# Patient Record
Sex: Female | Born: 1935 | Race: White | Hispanic: No | Smoking: Never smoker
Health system: Southern US, Community
[De-identification: ages and names within clinical notes are randomized; demographics above are authoritative.]

## PROBLEM LIST (undated history)

## (undated) DIAGNOSIS — E785 Hyperlipidemia, unspecified: Secondary | ICD-10-CM

## (undated) DIAGNOSIS — R55 Syncope and collapse: Secondary | ICD-10-CM

## (undated) HISTORY — PX: OTHER SURGICAL HISTORY: SHX169

## (undated) HISTORY — DX: Syncope and collapse: R55

## (undated) HISTORY — DX: Hyperlipidemia, unspecified: E78.5

---

## 2009-06-19 ENCOUNTER — Emergency Department (HOSPITAL_BASED_OUTPATIENT_CLINIC_OR_DEPARTMENT_OTHER): Admission: EM | Admit: 2009-06-19 | Discharge: 2009-06-19 | Payer: Self-pay | Admitting: Emergency Medicine

## 2011-06-26 ENCOUNTER — Other Ambulatory Visit: Payer: Self-pay | Admitting: Internal Medicine

## 2011-06-26 DIAGNOSIS — N6310 Unspecified lump in the right breast, unspecified quadrant: Secondary | ICD-10-CM

## 2011-07-13 ENCOUNTER — Other Ambulatory Visit: Payer: Self-pay | Admitting: Internal Medicine

## 2011-07-13 ENCOUNTER — Ambulatory Visit
Admission: RE | Admit: 2011-07-13 | Discharge: 2011-07-13 | Disposition: A | Discharge status: Home / Self Care | Payer: Medicare Other | Source: Ambulatory Visit | Attending: Internal Medicine | Admitting: Internal Medicine

## 2011-07-13 DIAGNOSIS — N6310 Unspecified lump in the right breast, unspecified quadrant: Secondary | ICD-10-CM

## 2011-12-04 ENCOUNTER — Other Ambulatory Visit: Payer: Self-pay | Admitting: Internal Medicine

## 2011-12-04 DIAGNOSIS — N63 Unspecified lump in unspecified breast: Secondary | ICD-10-CM

## 2012-01-15 ENCOUNTER — Ambulatory Visit
Admission: RE | Admit: 2012-01-15 | Discharge: 2012-01-15 | Disposition: A | Discharge status: Home / Self Care | Payer: Medicare Other | Source: Ambulatory Visit | Attending: Internal Medicine | Admitting: Internal Medicine

## 2012-01-15 DIAGNOSIS — N63 Unspecified lump in unspecified breast: Secondary | ICD-10-CM

## 2012-05-08 HISTORY — PX: BREAST BIOPSY: SHX20

## 2012-06-11 ENCOUNTER — Other Ambulatory Visit: Payer: Self-pay | Admitting: Internal Medicine

## 2012-06-11 DIAGNOSIS — N6489 Other specified disorders of breast: Secondary | ICD-10-CM

## 2012-07-11 ENCOUNTER — Ambulatory Visit
Admission: RE | Admit: 2012-07-11 | Discharge: 2012-07-11 | Disposition: A | Discharge status: Home / Self Care | Payer: Medicare Other | Source: Ambulatory Visit | Attending: Internal Medicine | Admitting: Internal Medicine

## 2012-07-11 ENCOUNTER — Other Ambulatory Visit: Payer: Self-pay | Admitting: Internal Medicine

## 2012-07-11 DIAGNOSIS — N6489 Other specified disorders of breast: Secondary | ICD-10-CM

## 2012-07-17 ENCOUNTER — Other Ambulatory Visit: Payer: Self-pay | Admitting: Internal Medicine

## 2012-07-17 DIAGNOSIS — N6489 Other specified disorders of breast: Secondary | ICD-10-CM

## 2012-07-25 ENCOUNTER — Ambulatory Visit
Admission: RE | Admit: 2012-07-25 | Discharge: 2012-07-25 | Disposition: A | Discharge status: Home / Self Care | Payer: Medicare Other | Source: Ambulatory Visit | Attending: Internal Medicine | Admitting: Internal Medicine

## 2012-07-25 DIAGNOSIS — N6489 Other specified disorders of breast: Secondary | ICD-10-CM

## 2013-06-03 ENCOUNTER — Other Ambulatory Visit: Payer: Self-pay

## 2013-06-03 DIAGNOSIS — Z1231 Encounter for screening mammogram for malignant neoplasm of breast: Secondary | ICD-10-CM

## 2013-07-14 ENCOUNTER — Ambulatory Visit
Admission: RE | Admit: 2013-07-14 | Discharge: 2013-07-14 | Disposition: A | Discharge status: Home / Self Care | Payer: Medicare Other | Source: Ambulatory Visit

## 2013-07-14 ENCOUNTER — Ambulatory Visit: Payer: Medicare Other

## 2013-07-14 DIAGNOSIS — Z1231 Encounter for screening mammogram for malignant neoplasm of breast: Secondary | ICD-10-CM

## 2014-06-08 ENCOUNTER — Other Ambulatory Visit: Payer: Self-pay

## 2014-06-08 DIAGNOSIS — Z1231 Encounter for screening mammogram for malignant neoplasm of breast: Secondary | ICD-10-CM

## 2014-07-16 ENCOUNTER — Ambulatory Visit
Admission: RE | Admit: 2014-07-16 | Discharge: 2014-07-16 | Disposition: A | Discharge status: Home / Self Care | Payer: Medicare Other | Source: Ambulatory Visit

## 2014-07-16 ENCOUNTER — Ambulatory Visit: Payer: Self-pay

## 2014-07-16 DIAGNOSIS — Z1231 Encounter for screening mammogram for malignant neoplasm of breast: Secondary | ICD-10-CM

## 2015-06-18 ENCOUNTER — Other Ambulatory Visit: Payer: Self-pay

## 2015-06-18 DIAGNOSIS — Z1231 Encounter for screening mammogram for malignant neoplasm of breast: Secondary | ICD-10-CM

## 2015-07-29 ENCOUNTER — Ambulatory Visit
Admission: RE | Admit: 2015-07-29 | Discharge: 2015-07-29 | Disposition: A | Discharge status: Home / Self Care | Payer: Medicare Other | Source: Ambulatory Visit

## 2015-07-29 DIAGNOSIS — Z1231 Encounter for screening mammogram for malignant neoplasm of breast: Secondary | ICD-10-CM

## 2015-12-15 DIAGNOSIS — E7800 Pure hypercholesterolemia, unspecified: Secondary | ICD-10-CM | POA: Insufficient documentation

## 2016-06-16 ENCOUNTER — Other Ambulatory Visit: Payer: Self-pay | Admitting: Internal Medicine

## 2016-06-16 DIAGNOSIS — Z1231 Encounter for screening mammogram for malignant neoplasm of breast: Secondary | ICD-10-CM

## 2016-07-31 ENCOUNTER — Ambulatory Visit
Admission: RE | Admit: 2016-07-31 | Discharge: 2016-07-31 | Disposition: A | Discharge status: Home / Self Care | Payer: Medicare Other | Source: Ambulatory Visit | Attending: Internal Medicine | Admitting: Internal Medicine

## 2016-07-31 DIAGNOSIS — Z1231 Encounter for screening mammogram for malignant neoplasm of breast: Secondary | ICD-10-CM

## 2016-12-14 DIAGNOSIS — M8589 Other specified disorders of bone density and structure, multiple sites: Secondary | ICD-10-CM | POA: Insufficient documentation

## 2016-12-14 DIAGNOSIS — G8929 Other chronic pain: Secondary | ICD-10-CM | POA: Insufficient documentation

## 2017-07-04 ENCOUNTER — Other Ambulatory Visit: Payer: Self-pay | Admitting: Internal Medicine

## 2017-07-04 DIAGNOSIS — Z1231 Encounter for screening mammogram for malignant neoplasm of breast: Secondary | ICD-10-CM

## 2017-08-02 ENCOUNTER — Ambulatory Visit
Admission: RE | Admit: 2017-08-02 | Discharge: 2017-08-02 | Disposition: A | Discharge status: Home / Self Care | Payer: Medicare Other | Source: Ambulatory Visit | Attending: Internal Medicine | Admitting: Internal Medicine

## 2017-08-02 DIAGNOSIS — Z1231 Encounter for screening mammogram for malignant neoplasm of breast: Secondary | ICD-10-CM

## 2018-05-13 DIAGNOSIS — M19071 Primary osteoarthritis, right ankle and foot: Secondary | ICD-10-CM | POA: Insufficient documentation

## 2018-07-12 ENCOUNTER — Other Ambulatory Visit: Payer: Self-pay | Admitting: Internal Medicine

## 2018-07-12 DIAGNOSIS — Z1231 Encounter for screening mammogram for malignant neoplasm of breast: Secondary | ICD-10-CM

## 2018-07-26 ENCOUNTER — Other Ambulatory Visit: Payer: Self-pay | Admitting: Internal Medicine

## 2018-07-26 DIAGNOSIS — N649 Disorder of breast, unspecified: Secondary | ICD-10-CM

## 2018-07-30 ENCOUNTER — Ambulatory Visit: Payer: Medicare Other

## 2018-07-30 ENCOUNTER — Ambulatory Visit
Admission: RE | Admit: 2018-07-30 | Discharge: 2018-07-30 | Disposition: A | Discharge status: Home / Self Care | Payer: Medicare Other | Source: Ambulatory Visit | Attending: Internal Medicine | Admitting: Internal Medicine

## 2018-07-30 ENCOUNTER — Other Ambulatory Visit: Payer: Self-pay

## 2018-07-30 DIAGNOSIS — N649 Disorder of breast, unspecified: Secondary | ICD-10-CM

## 2018-08-13 ENCOUNTER — Ambulatory Visit: Payer: Medicare Other

## 2018-09-18 ENCOUNTER — Ambulatory Visit: Payer: Medicare Other

## 2018-10-01 ENCOUNTER — Ambulatory Visit
Admission: RE | Admit: 2018-10-01 | Discharge: 2018-10-01 | Disposition: A | Discharge status: Home / Self Care | Payer: Medicare Other | Source: Ambulatory Visit | Attending: Internal Medicine | Admitting: Internal Medicine

## 2018-10-01 ENCOUNTER — Other Ambulatory Visit: Payer: Self-pay

## 2018-10-01 DIAGNOSIS — Z1231 Encounter for screening mammogram for malignant neoplasm of breast: Secondary | ICD-10-CM

## 2018-10-02 ENCOUNTER — Other Ambulatory Visit: Payer: Self-pay | Admitting: Internal Medicine

## 2018-10-02 DIAGNOSIS — R921 Mammographic calcification found on diagnostic imaging of breast: Secondary | ICD-10-CM

## 2018-10-07 ENCOUNTER — Ambulatory Visit
Admission: RE | Admit: 2018-10-07 | Discharge: 2018-10-07 | Disposition: A | Discharge status: Home / Self Care | Payer: Medicare Other | Source: Ambulatory Visit | Attending: Internal Medicine | Admitting: Internal Medicine

## 2018-10-07 ENCOUNTER — Other Ambulatory Visit: Payer: Self-pay

## 2018-10-07 DIAGNOSIS — R921 Mammographic calcification found on diagnostic imaging of breast: Secondary | ICD-10-CM

## 2019-07-16 DIAGNOSIS — R7301 Impaired fasting glucose: Secondary | ICD-10-CM | POA: Insufficient documentation

## 2019-09-09 DIAGNOSIS — R42 Dizziness and giddiness: Secondary | ICD-10-CM | POA: Insufficient documentation

## 2020-01-27 ENCOUNTER — Ambulatory Visit: Payer: Medicare Other | Attending: Internal Medicine

## 2020-01-27 DIAGNOSIS — Z23 Encounter for immunization: Secondary | ICD-10-CM

## 2020-01-27 MED FILL — MODERNA COVID-19 VACCINE 10: 100 | 1 days supply | Qty: 1 | Fill #0

## 2020-01-27 NOTE — Progress Notes (Signed)
   Covid-19 Vaccination Clinic  Name:  Sherry Padilla    MRN: [id number] DOB: 1935/03/22  01/27/2020  [name] was observed post Covid-19 immunization for 15 minutes without incident. She was provided with Vaccine Information Sheet and instruction to access the V-Safe system.  Vaccinated by Fredirick Maudlin  [name] was instructed to call 911 with any severe reactions post vaccine: Marland Kitchen Difficulty breathing  . Swelling of face and throat  . A fast heartbeat  . A bad rash all over body  . Dizziness and weakness

## 2020-07-20 DIAGNOSIS — R42 Dizziness and giddiness: Secondary | ICD-10-CM | POA: Insufficient documentation

## 2020-07-20 DIAGNOSIS — R000 Tachycardia, unspecified: Secondary | ICD-10-CM | POA: Insufficient documentation

## 2020-09-08 DIAGNOSIS — M542 Cervicalgia: Secondary | ICD-10-CM | POA: Insufficient documentation

## 2020-09-28 ENCOUNTER — Ambulatory Visit: Payer: Medicare Other | Attending: Internal Medicine

## 2020-09-28 DIAGNOSIS — Z23 Encounter for immunization: Secondary | ICD-10-CM

## 2020-09-28 NOTE — Progress Notes (Signed)
   Covid-19 Vaccination Clinic  Name:  Twilia Yaklin    MRN: [id number] DOB: 1934/11/06  09/28/2020  Ms. Dech was observed post Covid-19 immunization for 15 minutes without incident. She was provided with Vaccine Information Sheet and instruction to access the V-Safe system.   Ms. Tashiro was instructed to call 911 with any severe reactions post vaccine: Marland Kitchen Difficulty breathing  . Swelling of face and throat  . A fast heartbeat  . A bad rash all over body  . Dizziness and weakness   Immunizations Administered    Name Date Dose VIS Date Route   Moderna Covid-19 Booster Vaccine 09/28/2020 10:52 AM 0.25 mL 02/25/2020 Intramuscular   Manufacturer: Moderna   Lot: [id number]   NDC: 81103-159-45

## 2020-10-05 ENCOUNTER — Other Ambulatory Visit (HOSPITAL_BASED_OUTPATIENT_CLINIC_OR_DEPARTMENT_OTHER): Payer: Self-pay

## 2020-10-05 MED ORDER — MODERNA COVID-19 VACCINE 100 MCG/0.5ML IM SUSP
INTRAMUSCULAR | 0 refills | Status: DC
  Filled 2020-10-05: qty 0.25, 1d supply, fill #0

## 2020-11-15 DIAGNOSIS — M25551 Pain in right hip: Secondary | ICD-10-CM | POA: Diagnosis not present

## 2020-11-15 DIAGNOSIS — S300XXA Contusion of lower back and pelvis, initial encounter: Secondary | ICD-10-CM | POA: Diagnosis not present

## 2020-11-15 DIAGNOSIS — S0990XA Unspecified injury of head, initial encounter: Secondary | ICD-10-CM | POA: Diagnosis not present

## 2020-11-15 DIAGNOSIS — M47816 Spondylosis without myelopathy or radiculopathy, lumbar region: Secondary | ICD-10-CM | POA: Diagnosis not present

## 2020-11-15 DIAGNOSIS — S0003XA Contusion of scalp, initial encounter: Secondary | ICD-10-CM | POA: Diagnosis not present

## 2020-11-23 DIAGNOSIS — S0003XD Contusion of scalp, subsequent encounter: Secondary | ICD-10-CM | POA: Diagnosis not present

## 2021-01-03 DIAGNOSIS — S92502A Displaced unspecified fracture of left lesser toe(s), initial encounter for closed fracture: Secondary | ICD-10-CM | POA: Diagnosis not present

## 2021-01-03 DIAGNOSIS — M2042 Other hammer toe(s) (acquired), left foot: Secondary | ICD-10-CM | POA: Diagnosis not present

## 2021-01-03 DIAGNOSIS — M79672 Pain in left foot: Secondary | ICD-10-CM | POA: Diagnosis not present

## 2021-01-13 DIAGNOSIS — R7301 Impaired fasting glucose: Secondary | ICD-10-CM | POA: Diagnosis not present

## 2021-01-13 DIAGNOSIS — E7800 Pure hypercholesterolemia, unspecified: Secondary | ICD-10-CM | POA: Diagnosis not present

## 2021-01-13 DIAGNOSIS — Z0189 Encounter for other specified special examinations: Secondary | ICD-10-CM | POA: Diagnosis not present

## 2021-01-18 DIAGNOSIS — Z23 Encounter for immunization: Secondary | ICD-10-CM | POA: Diagnosis not present

## 2021-04-11 IMAGING — MG DIGITAL SCREENING BILATERAL MAMMOGRAM WITH TOMO AND CAD
8 series · 9 of 24 positions shown · non-contrast
Comparison: Previous exam(s).

CLINICAL DATA: Screening.

EXAM:
DIGITAL SCREENING BILATERAL MAMMOGRAM WITH TOMO AND CAD

[L MLO synth-2D]
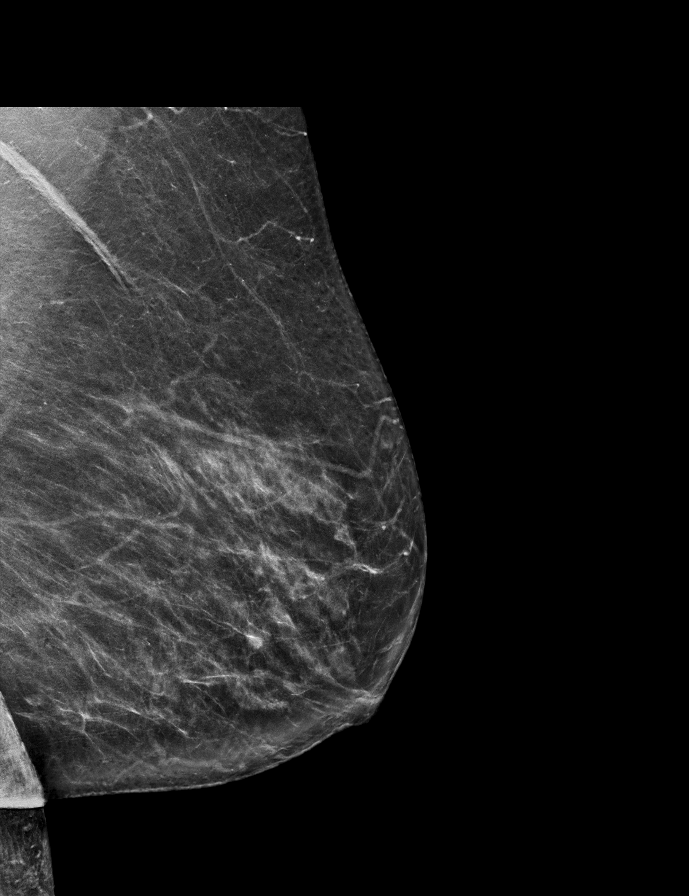

[R MLO synth-2D]
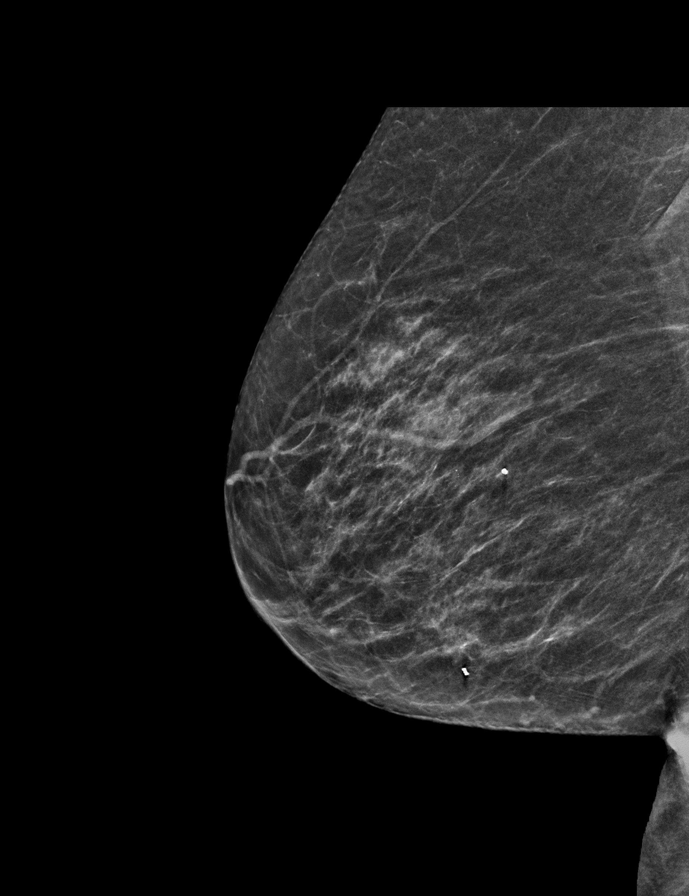

[L CC synth-2D]
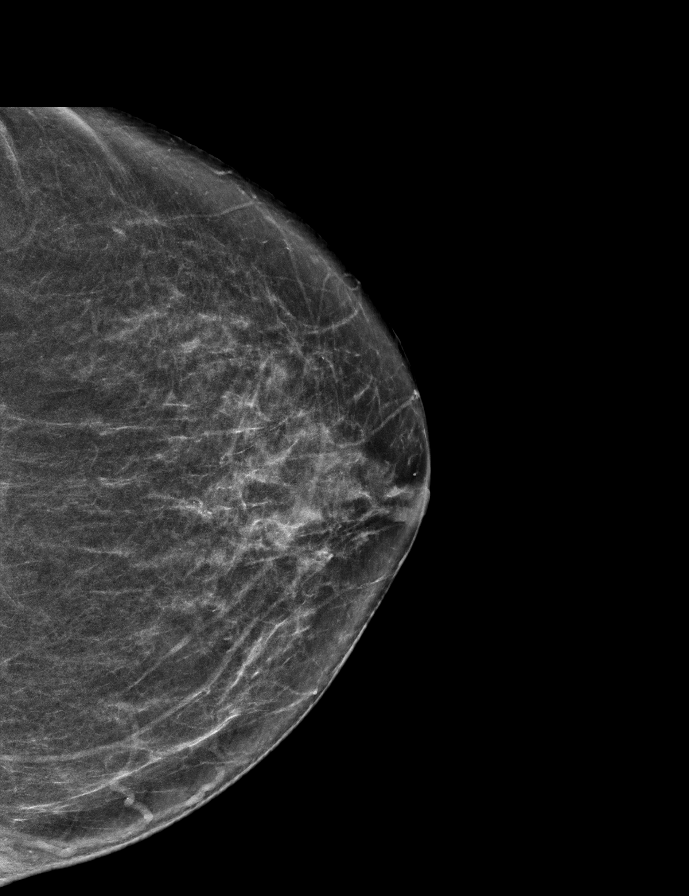

[R CC synth-2D]
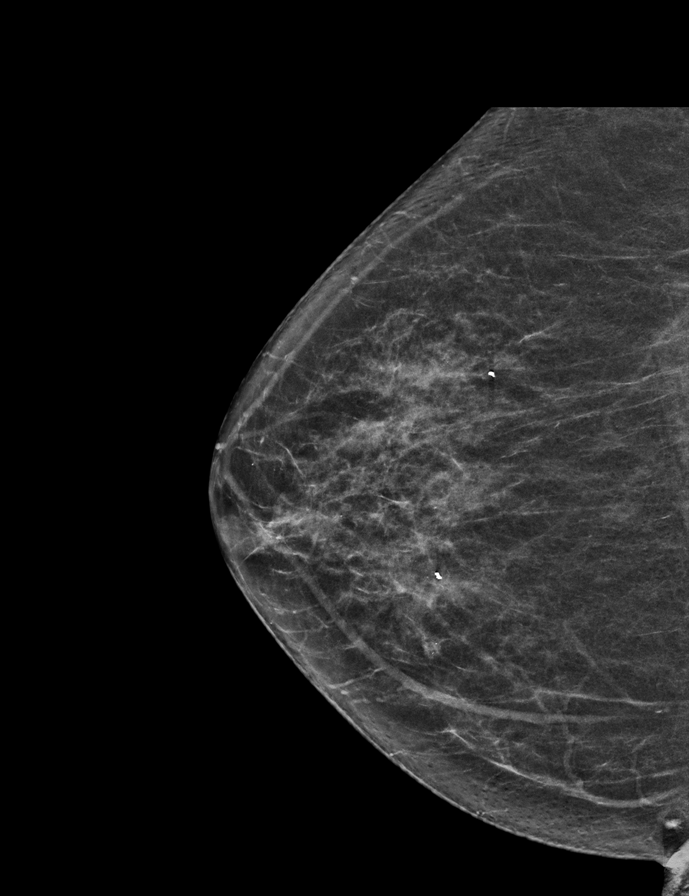

[L CC tomo · 2 of 58 frames shown]
[frame 19/58]
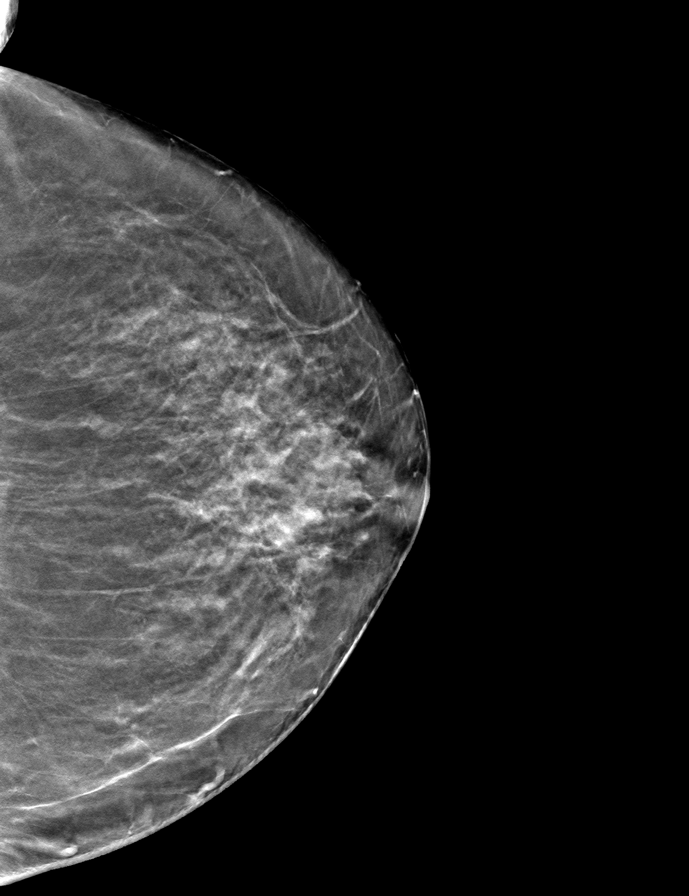
[frame 29/58]
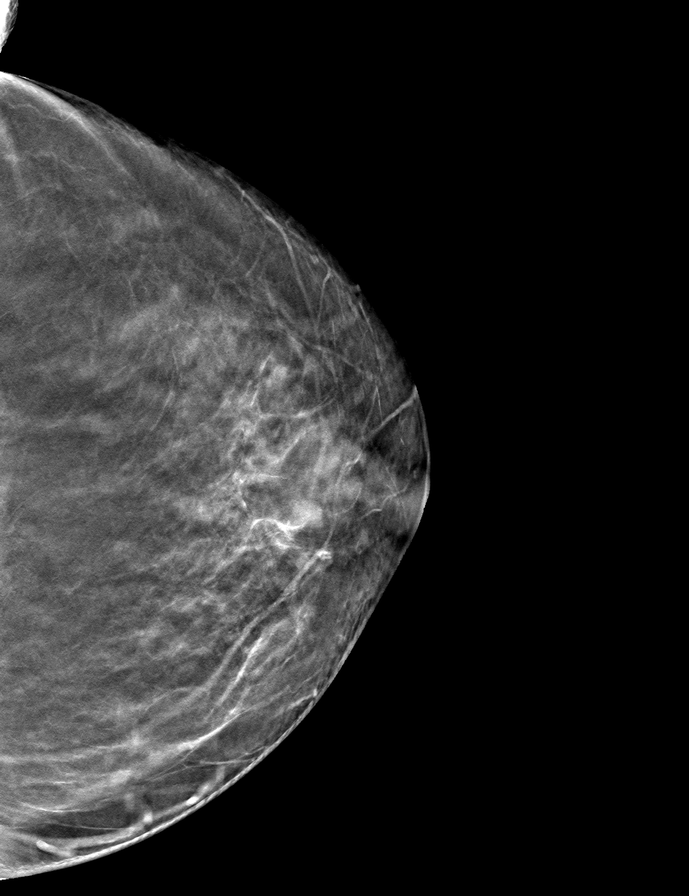

[L MLO tomo · tomo slice 33/64.0]
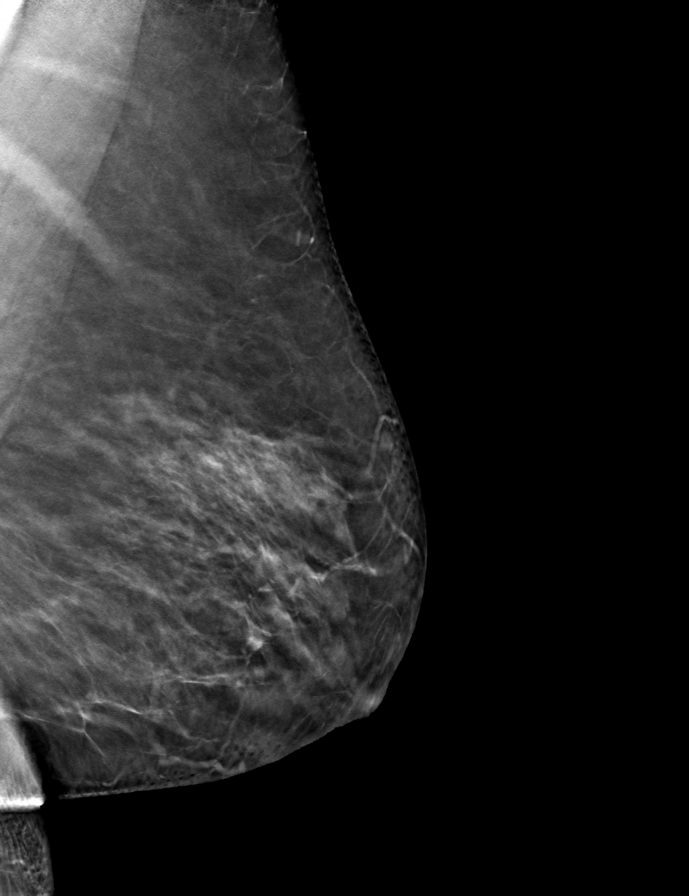

[R MLO tomo · tomo slice 27/52.0]
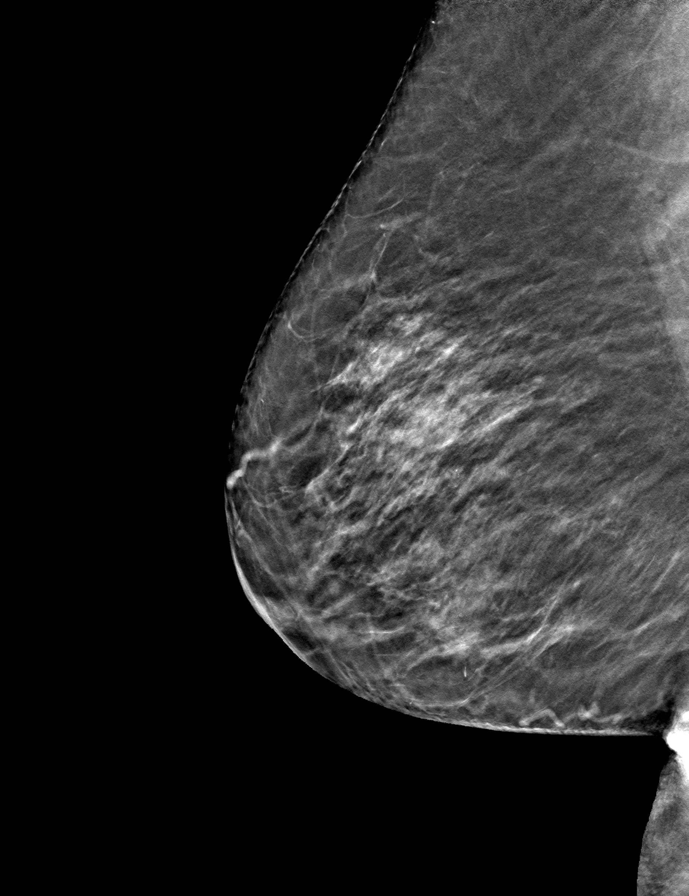

[R CC tomo · tomo slice 27/54.0]
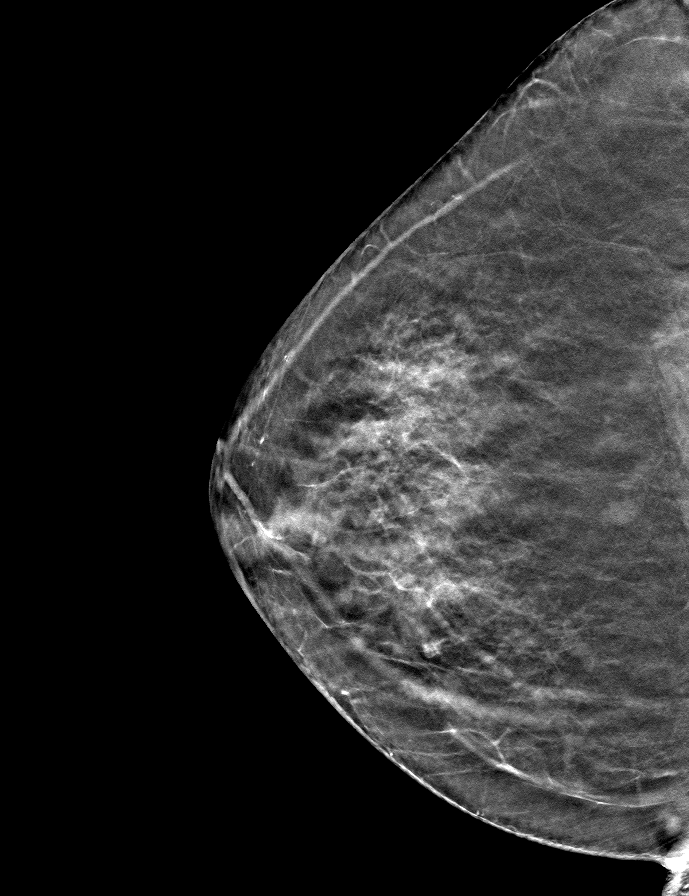

[9 of 24 positions shown; findings below may reference images not displayed]

ACR Breast Density Category b: There are scattered areas of
fibroglandular density.
FINDINGS: In the right breast, calcifications warrant further evaluation with
magnified views. In the left breast, no findings suspicious for
malignancy. Images were processed with CAD.
IMPRESSION: Further evaluation is suggested for calcifications in the right
breast.

RECOMMENDATION:
Diagnostic mammogram of the right breast. (Code:[id number])

The patient will be contacted regarding the findings, and additional
imaging will be scheduled.

BI-RADS CATEGORY  0: Incomplete. Need additional imaging evaluation
and/or prior mammograms for comparison.

## 2021-04-13 DIAGNOSIS — M76822 Posterior tibial tendinitis, left leg: Secondary | ICD-10-CM | POA: Insufficient documentation

## 2021-04-17 IMAGING — MG DIGITAL DIAGNOSTIC UNILATERAL RIGHT MAMMOGRAM
3 series · 3 of 3 positions shown · non-contrast
Comparison: Previous exam(s).

CLINICAL DATA: Calcifications in the medial right breast on a
recent screening mammogram. Patient had a stereotactic guided core
needle biopsy of an asymmetry in the medial right breast 4451 with
benign pathology results of fibrocystic changes with focal
calcifications.

EXAM:
DIGITAL DIAGNOSTIC RIGHT MAMMOGRAM WITH CAD

[R ML (1 of 2)]
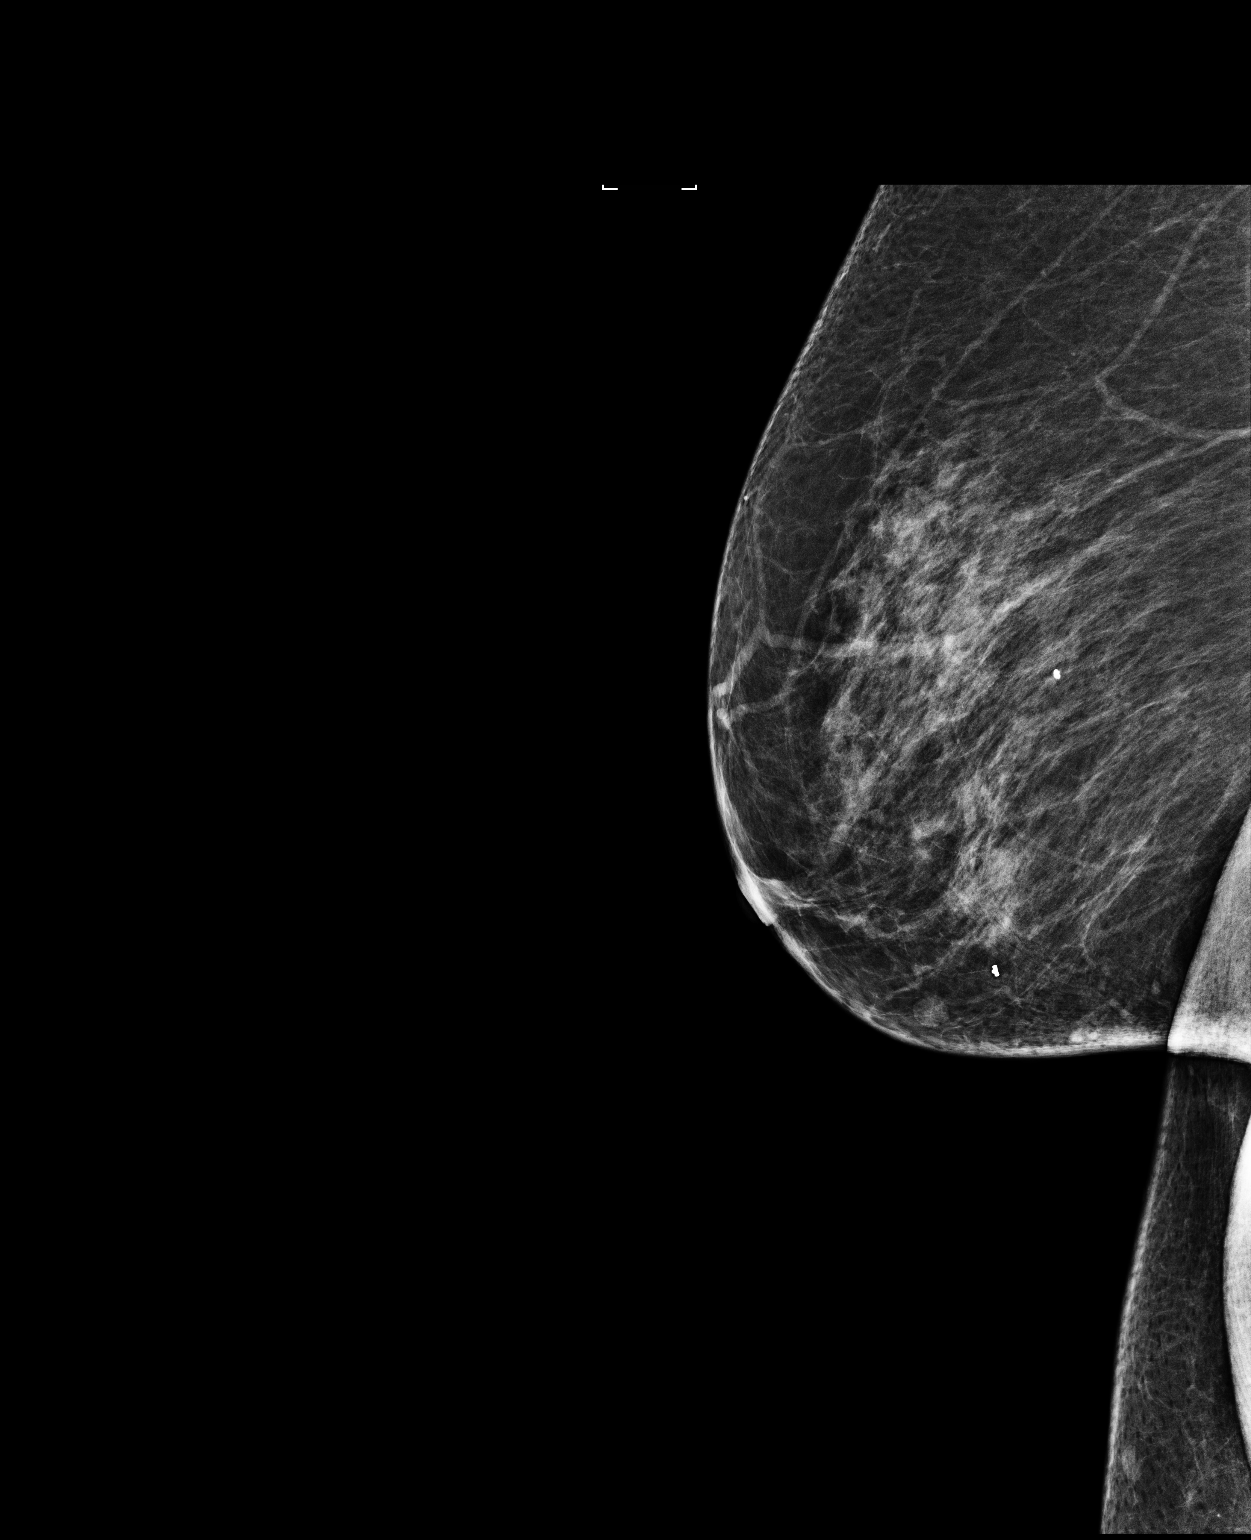

[R CC]
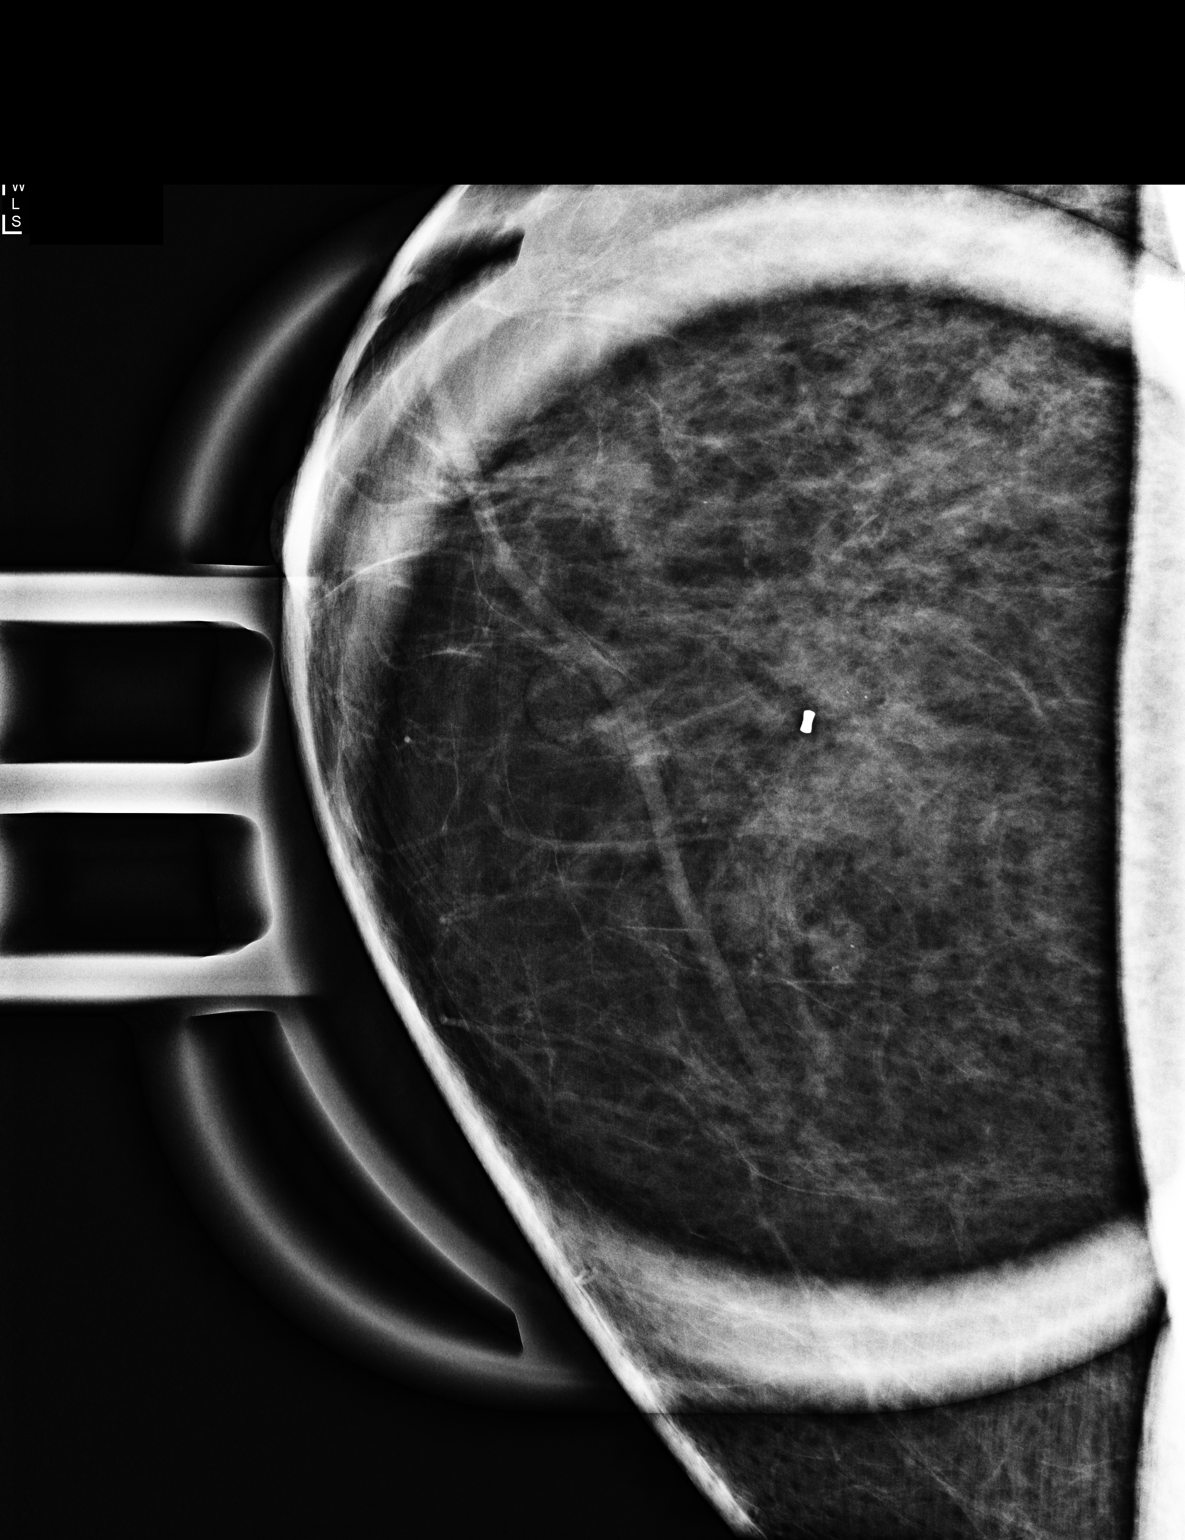

[R ML (2 of 2)]
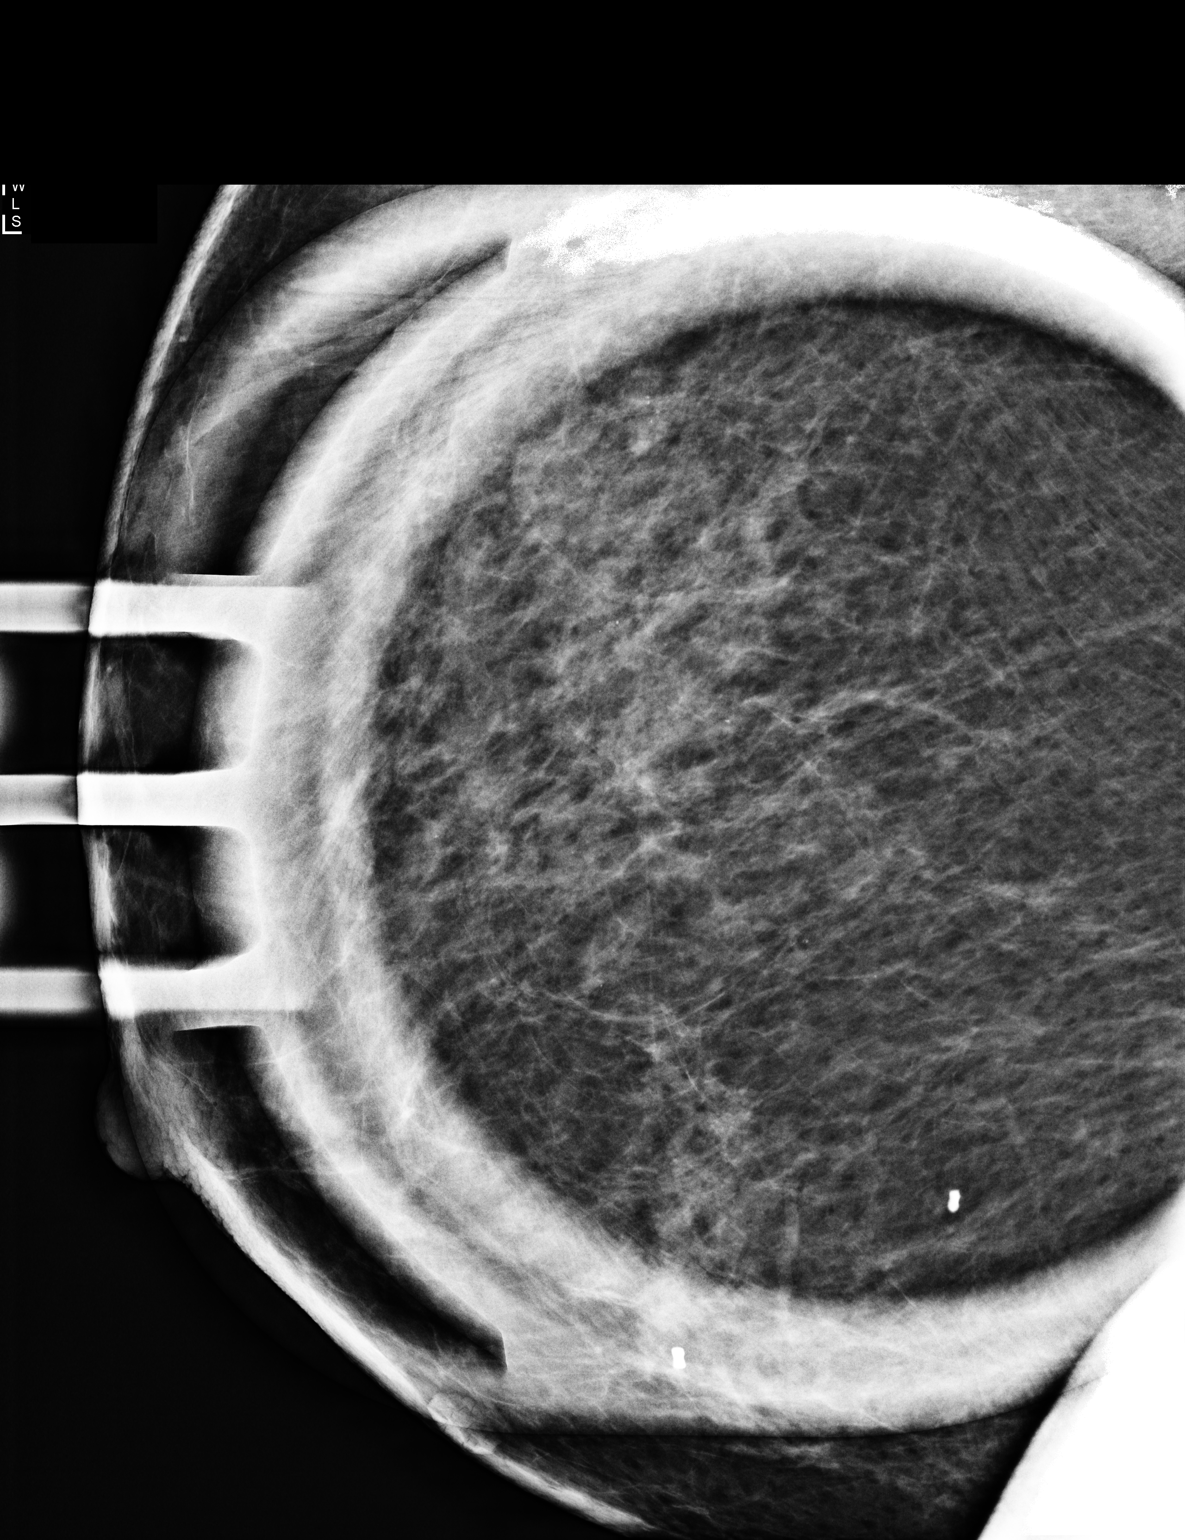

[3 of 3 positions shown; findings below may reference images not displayed]

ACR Breast Density Category b: There are scattered areas of
fibroglandular density.
FINDINGS: 2D true lateral and spot magnification views of the right breast
were obtained. These demonstrate a small number of scattered, tiny,
oval calcifications in the breast. There are no grouped
calcifications suspicious for malignancy.

Mammographic images were processed with CAD.
IMPRESSION: Benign right breast calcifications.  No evidence of malignancy.

RECOMMENDATION:
Bilateral screening mammogram 1 year.

I have discussed the findings and recommendations with the patient.
Results were also provided in writing at the conclusion of the
visit. If applicable, a reminder letter will be sent to the patient
regarding the next appointment.

BI-RADS CATEGORY  2: Benign.

## 2021-04-21 DIAGNOSIS — Z961 Presence of intraocular lens: Secondary | ICD-10-CM | POA: Diagnosis not present

## 2021-04-21 DIAGNOSIS — H40013 Open angle with borderline findings, low risk, bilateral: Secondary | ICD-10-CM | POA: Diagnosis not present

## 2021-04-21 DIAGNOSIS — H353121 Nonexudative age-related macular degeneration, left eye, early dry stage: Secondary | ICD-10-CM | POA: Diagnosis not present

## 2021-04-21 DIAGNOSIS — H43812 Vitreous degeneration, left eye: Secondary | ICD-10-CM | POA: Diagnosis not present

## 2021-05-27 ENCOUNTER — Ambulatory Visit: Payer: Medicare Other | Attending: Internal Medicine

## 2021-05-27 DIAGNOSIS — Z23 Encounter for immunization: Secondary | ICD-10-CM

## 2021-05-27 NOTE — Progress Notes (Signed)
° °  Covid-19 Vaccination Clinic  Name:  Sherry Padilla    MRN: [id number] DOB: [birth year]  05/27/2021  Sherry Padilla was observed post Covid-19 immunization for 15 minutes without incident. She was provided with Vaccine Information Sheet and instruction to access the V-Safe system.   Sherry Padilla was instructed to call 911 with any severe reactions post vaccine: Difficulty breathing  Swelling of face and throat  A fast heartbeat  A bad rash all over body  Dizziness and weakness   Immunizations Administered     Name Date Dose VIS Date Route   Moderna Covid-19 vaccine Bivalent Booster 05/27/2021 10:39 AM 0.5 mL 12/18/2020 Intramuscular   Manufacturer: Gala Murdoch   Lot: [id number]   NDC: 02637-858-85

## 2021-05-30 ENCOUNTER — Other Ambulatory Visit (HOSPITAL_BASED_OUTPATIENT_CLINIC_OR_DEPARTMENT_OTHER): Payer: Self-pay

## 2021-05-30 DIAGNOSIS — M2042 Other hammer toe(s) (acquired), left foot: Secondary | ICD-10-CM | POA: Diagnosis not present

## 2021-05-30 DIAGNOSIS — M2041 Other hammer toe(s) (acquired), right foot: Secondary | ICD-10-CM | POA: Diagnosis not present

## 2021-05-30 DIAGNOSIS — M76822 Posterior tibial tendinitis, left leg: Secondary | ICD-10-CM | POA: Diagnosis not present

## 2021-05-30 MED ORDER — MODERNA COVID-19 BIVAL BOOSTER 50 MCG/0.5ML IM SUSP
INTRAMUSCULAR | 0 refills | Status: DC
  Filled 2021-05-30: qty 0.5, 1d supply, fill #0

## 2021-09-23 DIAGNOSIS — Q6689 Other  specified congenital deformities of feet: Secondary | ICD-10-CM | POA: Diagnosis not present

## 2021-09-23 DIAGNOSIS — M79672 Pain in left foot: Secondary | ICD-10-CM | POA: Diagnosis not present

## 2021-09-23 DIAGNOSIS — M79671 Pain in right foot: Secondary | ICD-10-CM | POA: Diagnosis not present

## 2021-09-23 DIAGNOSIS — L84 Corns and callosities: Secondary | ICD-10-CM | POA: Diagnosis not present

## 2021-09-29 DIAGNOSIS — L820 Inflamed seborrheic keratosis: Secondary | ICD-10-CM | POA: Diagnosis not present

## 2021-09-29 DIAGNOSIS — L821 Other seborrheic keratosis: Secondary | ICD-10-CM | POA: Diagnosis not present

## 2021-09-29 DIAGNOSIS — L814 Other melanin hyperpigmentation: Secondary | ICD-10-CM | POA: Diagnosis not present

## 2022-03-17 DIAGNOSIS — M79671 Pain in right foot: Secondary | ICD-10-CM | POA: Diagnosis not present

## 2022-03-17 DIAGNOSIS — L84 Corns and callosities: Secondary | ICD-10-CM | POA: Diagnosis not present

## 2022-03-17 DIAGNOSIS — L602 Onychogryphosis: Secondary | ICD-10-CM | POA: Diagnosis not present

## 2022-03-17 DIAGNOSIS — M79672 Pain in left foot: Secondary | ICD-10-CM | POA: Diagnosis not present

## 2022-05-09 DIAGNOSIS — H401234 Low-tension glaucoma, bilateral, indeterminate stage: Secondary | ICD-10-CM | POA: Diagnosis not present

## 2022-05-09 DIAGNOSIS — Z961 Presence of intraocular lens: Secondary | ICD-10-CM | POA: Diagnosis not present

## 2022-05-09 DIAGNOSIS — H353122 Nonexudative age-related macular degeneration, left eye, intermediate dry stage: Secondary | ICD-10-CM | POA: Diagnosis not present

## 2022-05-17 ENCOUNTER — Non-Acute Institutional Stay: Payer: Medicare Other | Admitting: Internal Medicine

## 2022-05-17 ENCOUNTER — Encounter: Payer: Self-pay | Admitting: Internal Medicine

## 2022-05-17 VITALS — BP 132/76 | HR 97 | Temp 98.2°F | Resp 16 | Ht 62.0 in | Wt 135.6 lb

## 2022-05-17 DIAGNOSIS — M2042 Other hammer toe(s) (acquired), left foot: Secondary | ICD-10-CM

## 2022-05-17 DIAGNOSIS — M2041 Other hammer toe(s) (acquired), right foot: Secondary | ICD-10-CM

## 2022-05-17 DIAGNOSIS — H6121 Impacted cerumen, right ear: Secondary | ICD-10-CM

## 2022-05-17 DIAGNOSIS — H409 Unspecified glaucoma: Secondary | ICD-10-CM

## 2022-05-17 DIAGNOSIS — E785 Hyperlipidemia, unspecified: Secondary | ICD-10-CM

## 2022-05-17 DIAGNOSIS — H353121 Nonexudative age-related macular degeneration, left eye, early dry stage: Secondary | ICD-10-CM

## 2022-05-17 NOTE — Progress Notes (Signed)
Location:  [facility] (12)  Provider:   Code Status:  Goals of Care:     05/17/2022   10:25 AM  Advanced Directives  Does Patient Have a Medical Advance Directive? Yes  Type of Advance Directive Out of facility DNR (pink MOST or yellow form);Living will;Healthcare Power of [employer] of [place] in Chart? No - copy requested  Would patient like information on creating a medical advance directive? No - Guardian declined     Chief Complaint  Patient presents with   New Patient (Initial Visit)    Patient states she is here to establish care.    HPI: Patient is a 88 y.o. female seen today for medical management of chronic diseases.    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 Wax Had tried Debrox It helped some but still feels there is something there No Other issues Walks with no issue Had Right Arthroplasty but it does not have much improvement   No past medical history on file.  Past Surgical History:  Procedure Laterality Date   BREAST BIOPSY Right 2014    Allergies  Allergen Reactions   Chlorine Other (See Comments)    Sinus drainage    Outpatient Encounter Medications as of 05/17/2022  Medication Sig   latanoprost (XALATAN) 0.005 % ophthalmic solution Place 1 drop into both eyes at bedtime.   Multiple Vitamins-Minerals (PRESERVISION AREDS 2) CAPS Take by mouth.   B Complex-C-Folic Acid (RENAL) 1 MG CAPS Take 1 capsule by mouth.   Cholecalciferol 100 MCG (4000 UT) CAPS Take 1 capsule by mouth.   COVID-19 mRNA bivalent vaccine, Moderna, (MODERNA COVID-19 BIVAL BOOSTER) 50 MCG/0.5ML injection Inject into the muscle.   COVID-19 mRNA vaccine, Moderna, (MODERNA COVID-19 VACCINE) 100 MCG/0.5ML injection Inject into the muscle.   Multiple Vitamin (MULTIVITAMIN) capsule Take 1 capsule by mouth  daily.   Omega-3 1000 MG CAPS Take 1 capsule by mouth daily.   simvastatin (ZOCOR) 20 MG tablet Take 20 mg by mouth at bedtime.   TURMERIC CURCUMIN PO by Misc.(Non-Drug; Combo Route) route.   No facility-administered encounter medications on file as of 05/17/2022.    Review of Systems:  Review of Systems  Constitutional:  Negative for activity change and appetite change.  HENT: Negative.    Respiratory:  Negative for cough and shortness of breath.   Cardiovascular:  Negative for leg swelling.  Gastrointestinal:  Negative for constipation.  Genitourinary: Negative.   Musculoskeletal:  Negative for arthralgias, gait problem and myalgias.  Skin: Negative.   Neurological:  Negative for dizziness and weakness.  Psychiatric/Behavioral:  Negative for confusion, dysphoric mood and sleep disturbance.     Health Maintenance  Topic Date Due   DEXA SCAN  Never done   Medicare Annual Wellness (AWV)  04/22/2021   COVID-19 Vaccine (7 - 2023-24 season) 05/09/2022   Pneumonia Vaccine 38+ Years old (1 - PCV) 05/18/2023 (Originally 07/02/2001)   INFLUENZA VACCINE  Completed   Zoster Vaccines- Shingrix  Completed   HPV VACCINES  Aged Out   DTaP/Tdap/Td  Discontinued    Physical Exam: Vitals:   05/17/22 1020  BP: 132/76  Pulse: 97  Resp: 16  Temp: 98.2 F (36.8 C)  TempSrc: Temporal  SpO2: 97%  Weight: 135 lb 9.6 oz (61.5 kg)  Height:  5\' 2"  (1.575 m)   Body mass index is 24.8 kg/m. Physical Exam Vitals reviewed.  Constitutional:      Appearance: Normal appearance.  HENT:     Head: Normocephalic.     Right Ear: Tympanic membrane normal.     Left Ear: Tympanic membrane normal.     Ears:     Comments: Mild Wax near her ear drum    Nose: Nose normal.     Mouth/Throat:     Mouth: Mucous membranes are moist.     Pharynx: Oropharynx is clear.  Eyes:     Pupils: Pupils are equal, round, and reactive to light.  Cardiovascular:     Rate and Rhythm: Normal rate and regular rhythm.      Pulses: Normal pulses.     Heart sounds: Normal heart sounds. No murmur heard. Pulmonary:     Effort: Pulmonary effort is normal.     Breath sounds: Normal breath sounds.  Abdominal:     General: Abdomen is flat. Bowel sounds are normal.     Palpations: Abdomen is soft.  Musculoskeletal:        General: No swelling.     Cervical back: Neck supple.  Skin:    General: Skin is warm.  Neurological:     General: No focal deficit present.     Mental Status: She is alert and oriented to person, place, and time.  Psychiatric:        Mood and Affect: Mood normal.        Thought Content: Thought content normal.     Labs reviewed: Basic Metabolic Panel: No results for input(s): "NA", "K", "CL", "CO2", "GLUCOSE", "BUN", "CREATININE", "CALCIUM", "MG", "PHOS", "TSH" in the last 8760 hours. Liver Function Tests: No results for input(s): "AST", "ALT", "ALKPHOS", "BILITOT", "PROT", "ALBUMIN" in the last 8760 hours. No results for input(s): "LIPASE", "AMYLASE" in the last 8760 hours. No results for input(s): "AMMONIA" in the last 8760 hours. CBC: No results for input(s): "WBC", "NEUTROABS", "HGB", "HCT", "MCV", "PLT" in the last 8760 hours. Lipid Panel: No results for input(s): "CHOL", "HDL", "LDLCALC", "TRIG", "CHOLHDL", "LDLDIRECT" in the last 8760 hours. No results found for: "HGBA1C"  Procedures since last visit: No results found.  Assessment/Plan 1. Hyperlipidemia, unspecified hyperlipidemia type Statin  - TSH; Future - Lipid panel; Future - COMPLETE METABOLIC PANEL WITH GFR; Future - CBC with Differential/Platelet; Future  2. 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 Glaucoma of both eyes, unspecified glaucoma type Follows with Opthalmologist  4. Hammer toes of both feet Follows with Podiatry  5. Impacted cerumen of right ear  - Ambulatory referral to ENT    Labs/tests ordered:  * No order type  specified * Next appt:  05/22/2022

## 2022-05-22 ENCOUNTER — Other Ambulatory Visit: Payer: Medicare Other

## 2022-05-22 DIAGNOSIS — E785 Hyperlipidemia, unspecified: Secondary | ICD-10-CM | POA: Diagnosis not present

## 2022-05-22 LAB — COMPLETE METABOLIC PANEL WITH GFR
AG Ratio: 1.7 (calc) (ref 1.0–2.5)
ALT: 20 U/L (ref 6–29)
AST: 22 U/L (ref 10–35)
Albumin: 4.5 g/dL (ref 3.6–5.1)
Alkaline phosphatase (APISO): 88 U/L (ref 37–153)
BUN: 15 mg/dL (ref 7–25)
CO2: 31 mmol/L (ref 20–32)
Calcium: 9.7 mg/dL (ref 8.6–10.4)
Chloride: 101 mmol/L (ref 98–110)
Creat: 0.76 mg/dL (ref 0.60–0.95)
Globulin: 2.7 g/dL (calc) (ref 1.9–3.7)
Glucose, Bld: 83 mg/dL (ref 65–99)
Potassium: 4.6 mmol/L (ref 3.5–5.3)
Sodium: 140 mmol/L (ref 135–146)
Total Bilirubin: 0.8 mg/dL (ref 0.2–1.2)
Total Protein: 7.2 g/dL (ref 6.1–8.1)
eGFR: 77 mL/min/{1.73_m2} (ref >=60)

## 2022-05-22 LAB — CBC WITH DIFFERENTIAL/PLATELET
Absolute Monocytes: 630 cells/uL (ref 200–950)
Basophils Absolute: 38 cells/uL (ref 0–200)
Basophils Relative: 0.6 %
Eosinophils Absolute: 170 cells/uL (ref 15–500)
Eosinophils Relative: 2.7 %
HCT: 46.2 % — ABNORMAL HIGH (ref 35.0–45.0)
Hemoglobin: 15.8 g/dL — ABNORMAL HIGH (ref 11.7–15.5)
Lymphs Abs: 1493 cells/uL (ref 850–3900)
MCH: 30.9 pg (ref 27.0–33.0)
MCHC: 34.2 g/dL (ref 32.0–36.0)
MCV: 90.2 fL (ref 80.0–100.0)
MPV: 11.2 fL (ref 7.5–12.5)
Monocytes Relative: 10 %
Neutro Abs: 3969 cells/uL (ref 1500–7800)
Neutrophils Relative %: 63 %
Platelets: 251 10*3/uL (ref 140–400)
RBC: 5.12 10*6/uL — ABNORMAL HIGH (ref 3.80–5.10)
RDW: 12.9 % (ref 11.0–15.0)
Total Lymphocyte: 23.7 %
WBC: 6.3 10*3/uL (ref 3.8–10.8)

## 2022-05-22 LAB — LIPID PANEL
Cholesterol: 165 mg/dL (ref <200)
HDL: 69 mg/dL (ref >=50)
LDL Cholesterol (Calc): 79 mg/dL (calc)
Non-HDL Cholesterol (Calc): 96 mg/dL (calc) (ref <130)
Total CHOL/HDL Ratio: 2.4 (calc) (ref <5.0)
Triglycerides: 90 mg/dL (ref <150)

## 2022-05-23 LAB — TSH: TSH: 3.64 mIU/L (ref 0.40–4.50)

## 2022-06-07 ENCOUNTER — Other Ambulatory Visit: Payer: Self-pay

## 2022-06-07 MED ORDER — LATANOPROST 0.005 % OP SOLN: 1.0000 [drp] | Freq: Every day | OPHTHALMIC | 0 refills | Status: DC

## 2022-06-07 NOTE — Telephone Encounter (Signed)
Patient called stating the she nor the parmacy are able to get in touch with the eye doctor to refill her eye drops. She would like to know if the refill can be done through our office.  Message routed to Windell Moulding, NP

## 2022-06-14 ENCOUNTER — Ambulatory Visit: Payer: Medicare Other | Admitting: Internal Medicine

## 2022-06-22 ENCOUNTER — Encounter (HOSPITAL_BASED_OUTPATIENT_CLINIC_OR_DEPARTMENT_OTHER): Payer: Self-pay

## 2022-06-22 ENCOUNTER — Emergency Department (HOSPITAL_BASED_OUTPATIENT_CLINIC_OR_DEPARTMENT_OTHER): Payer: Medicare Other

## 2022-06-22 ENCOUNTER — Other Ambulatory Visit: Payer: Self-pay

## 2022-06-22 ENCOUNTER — Emergency Department (HOSPITAL_BASED_OUTPATIENT_CLINIC_OR_DEPARTMENT_OTHER)
Admission: EM | Admit: 2022-06-22 | Discharge: 2022-06-22 | Disposition: A | Discharge status: Home / Self Care | Payer: Medicare Other | Attending: Emergency Medicine | Admitting: Emergency Medicine

## 2022-06-22 DIAGNOSIS — S0003XA Contusion of scalp, initial encounter: Secondary | ICD-10-CM | POA: Diagnosis not present

## 2022-06-22 DIAGNOSIS — R55 Syncope and collapse: Secondary | ICD-10-CM

## 2022-06-22 DIAGNOSIS — E860 Dehydration: Secondary | ICD-10-CM | POA: Insufficient documentation

## 2022-06-22 DIAGNOSIS — I10 Essential (primary) hypertension: Secondary | ICD-10-CM | POA: Diagnosis not present

## 2022-06-22 DIAGNOSIS — S0990XA Unspecified injury of head, initial encounter: Secondary | ICD-10-CM

## 2022-06-22 DIAGNOSIS — W07XXXA Fall from chair, initial encounter: Secondary | ICD-10-CM | POA: Diagnosis not present

## 2022-06-22 DIAGNOSIS — W19XXXA Unspecified fall, initial encounter: Secondary | ICD-10-CM | POA: Diagnosis not present

## 2022-06-22 DIAGNOSIS — R42 Dizziness and giddiness: Secondary | ICD-10-CM | POA: Diagnosis not present

## 2022-06-22 LAB — CBC WITH DIFFERENTIAL/PLATELET
Abs Immature Granulocytes: 0.06 10*3/uL (ref 0.00–0.07)
Basophils Absolute: 0 10*3/uL (ref 0.0–0.1)
Basophils Relative: 0 %
Eosinophils Absolute: 0.1 10*3/uL (ref 0.0–0.5)
Eosinophils Relative: 1 %
HCT: 45.3 % (ref 36.0–46.0)
Hemoglobin: 15 g/dL (ref 12.0–15.0)
Immature Granulocytes: 1 %
Lymphocytes Relative: 9 %
Lymphs Abs: 1 10*3/uL (ref 0.7–4.0)
MCH: 30.1 pg (ref 26.0–34.0)
MCHC: 33.1 g/dL (ref 30.0–36.0)
MCV: 90.8 fL (ref 80.0–100.0)
Monocytes Absolute: 0.8 10*3/uL (ref 0.1–1.0)
Monocytes Relative: 8 %
Neutro Abs: 8.6 10*3/uL — ABNORMAL HIGH (ref 1.7–7.7)
Neutrophils Relative %: 81 %
Platelets: 235 10*3/uL (ref 150–400)
RBC: 4.99 MIL/uL (ref 3.87–5.11)
RDW: 14.3 % (ref 11.5–15.5)
WBC: 10.5 10*3/uL (ref 4.0–10.5)
nRBC: 0 % (ref 0.0–0.2)

## 2022-06-22 LAB — BASIC METABOLIC PANEL
Anion gap: 9 (ref 5–15)
BUN: 25 mg/dL — ABNORMAL HIGH (ref 8–23)
CO2: 27 mmol/L (ref 22–32)
Calcium: 9.5 mg/dL (ref 8.9–10.3)
Chloride: 102 mmol/L (ref 98–111)
Creatinine, Ser: 0.98 mg/dL (ref 0.44–1.00)
GFR, Estimated: 57 mL/min — ABNORMAL LOW (ref >60)
Glucose, Bld: 122 mg/dL — ABNORMAL HIGH (ref 70–99)
Potassium: 4.2 mmol/L (ref 3.5–5.1)
Sodium: 138 mmol/L (ref 135–145)

## 2022-06-22 LAB — TROPONIN I (HIGH SENSITIVITY): Troponin I (High Sensitivity): 6 ng/L (ref <18)

## 2022-06-22 MED ORDER — SODIUM CHLORIDE 0.9 % IV BOLUS
500.0000 mL | Freq: Once | INTRAVENOUS | Status: AC
  Administered 2022-06-22: 500 mL via INTRAVENOUS

## 2022-06-22 NOTE — ED Notes (Signed)
Phone provided to patient to talk to neighbor, [employer]

## 2022-06-22 NOTE — Discharge Instructions (Signed)
You were seen in the emergency room today after a fall.  Your CT scan looked normal.  I suspect you may be mildly dehydrated leading to your passing out.  Please drink plenty of fluids and get up slowly.  Please call your primary doctor to schedule follow-up in the coming week.  Return with any new or suddenly worsening symptoms.

## 2022-06-22 NOTE — ED Triage Notes (Signed)
Pt was sitting in a chair and stood up and then after a minute started feeling lightheaded. States next thing she knew she woke up on the ground. Denies headache, has a small laceration and swelling to posterior head.

## 2022-06-22 NOTE — ED Notes (Signed)
Patient has a small wound on the back of her head surrounded by swelling. No hemorrhage present at this time.

## 2022-06-22 NOTE — ED Provider Notes (Signed)
Emergency Department Provider Note   I have reviewed the triage vital signs and the nursing notes.   HISTORY  Chief Complaint Loss of Consciousness   HPI Sherry Padilla is a 88 y.o. female with PMH of HLD presents to the ED after a fall today. Patient stood suddenly from a chair and woke up on the ground. She denies any chest pain or palpitations. She arrived by ambulance and is not having pain in the arms or legs.  No change to her routines.  No medication changes. She is currently feeling well.  She did hit the back of her head was having some soreness at that precise location but not in her neck.     History reviewed. No pertinent past medical history.  Review of Systems  Constitutional: No fever/chills Cardiovascular: Denies chest pain. Positive syncope.  Respiratory: Denies shortness of breath. Gastrointestinal: No abdominal pain.  No nausea, no vomiting.  No diarrhea.   Musculoskeletal: Negative for back pain. Skin: Negative for rash. Neurological: Negative for headaches, focal weakness or numbness.   ____________________________________________   PHYSICAL EXAM:  VITAL SIGNS: ED Triage Vitals  Enc Vitals Group     BP 06/22/22 1446 (!) 141/69     Pulse Rate 06/22/22 1446 (!) 102     Resp 06/22/22 1446 18     Temp 06/22/22 1446 98 F (36.7 C)     Temp Source 06/22/22 1446 Oral     SpO2 06/22/22 1441 98 %     Weight 06/22/22 1444 135 lb (61.2 kg)     Height 06/22/22 1444 5' 1"$  (1.549 m)    Constitutional: Alert and oriented. Well appearing and in no acute distress. Eyes: Conjunctivae are normal. PERRL.  Head: Hematoma to the occipital scalp without laceration. Small abrasion noted.  Nose: No congestion/rhinnorhea. Mouth/Throat: Mucous membranes are moist.   Neck: No stridor.  No cervical spine tenderness to palpation. Cardiovascular: Normal rate, regular rhythm. Good peripheral circulation. Grossly normal heart sounds.   Respiratory: Normal  respiratory effort.  No retractions. Lungs CTAB. Gastrointestinal: Soft and nontender. No distention.  Musculoskeletal: No lower extremity tenderness nor edema. No gross deformities of extremities. Normal ROM of the bilateral upper and lower extremities.  Neurologic:  Normal speech and language. No gross focal neurologic deficits are appreciated.  Skin:  Skin is warm, dry and intact. No rash noted.  ____________________________________________   LABS (all labs ordered are listed, but only abnormal results are displayed)  Labs Reviewed  BASIC METABOLIC PANEL - Abnormal; Notable for the following components:      Result Value   Glucose, Bld 122 (*)    BUN 25 (*)    GFR, Estimated 57 (*)    All other components within normal limits  CBC WITH DIFFERENTIAL/PLATELET - Abnormal; Notable for the following components:   Neutro Abs 8.6 (*)    All other components within normal limits  TROPONIN I (HIGH SENSITIVITY)  TROPONIN I (HIGH SENSITIVITY)   ____________________________________________  EKG   EKG Interpretation  Date/Time:  Thursday June 22 2022 14:57:19 EST Ventricular Rate:  101 PR Interval:  146 QRS Duration: 90 QT Interval:  351 QTC Calculation: 455 R Axis:   64 Text Interpretation: Sinus tachycardia Probable left atrial enlargement RSR' in V1 or V2, right VCD or RVH Confirmed by Nanda Quinton [phone]) on 06/22/2022 3:07:53 PM        ____________________________________________  RADIOLOGY  CT Head Wo Contrast  Result Date: 06/22/2022 CLINICAL DATA:  Head trauma. EXAM:  CT HEAD WITHOUT CONTRAST TECHNIQUE: Contiguous axial images were obtained from the base of the skull through the vertex without intravenous contrast. RADIATION DOSE REDUCTION: This exam was performed according to the departmental dose-optimization program which includes automated exposure control, adjustment of the mA and/or kV according to patient size and/or use of iterative reconstruction technique.  COMPARISON:  None Available. FINDINGS: Brain: There is periventricular white matter decreased attenuation consistent with small vessel ischemic changes. Ventricles, sulci and cisterns are prominent consistent with age related involutional changes. No acute intracranial hemorrhage, mass effect or shift. No hydrocephalus. Vascular: No hyperdense vessel or unexpected calcification. Skull: Normal. Negative for fracture or focal lesion. Right posterior parietal subcutaneous hyperdensity consistent with cephalohematoma. Sinuses/Orbits: No acute finding. IMPRESSION: 1. Atrophy and chronic small vessel ischemic changes. 2. Right posterior parietal cephalohematoma. 3. No acute intracranial process identified. Electronically Signed   By: Sammie Bench M.D.   On: 06/22/2022 15:29    ____________________________________________   PROCEDURES  Procedure(s) performed:   Procedures  None  ____________________________________________   INITIAL IMPRESSION / ASSESSMENT AND PLAN / ED COURSE  Pertinent labs & imaging results that were available during my care of the patient were reviewed by me and considered in my medical decision making (see chart for details).   This patient is Presenting for Evaluation of syncope, which does require a range of treatment options, and is a complaint that involves a high risk of morbidity and mortality.  The Differential Diagnoses includes subdural hematoma, epidural hematoma, acute concussion, traumatic subarachnoid hemorrhage, cerebral contusions, etc.   Critical Interventions-    Medications  sodium chloride 0.9 % bolus 500 mL ( Intravenous Stopped 06/22/22 1635)    Reassessment after intervention: Symptoms improved.    I did obtain Additional Historical Information from EMS.  Clinical Laboratory Tests Ordered, included troponin normal.  No acute kidney injury.  No anemia or leukocytosis.  Radiologic Tests Ordered, included CT head. I independently interpreted  the images and agree with radiology interpretation.   Cardiac Monitor Tracing which shows NSR.    Social Determinants of Health Risk patient is a non-smoker.    Medical Decision Making: Summary:  Patient presents to the emergency department for evaluation of syncope and fall with head injury.  Hematoma to the occipital scalp with abrasion but no laceration requiring repair.  Patient has neuro intact, awake, alert.  Head imaging is reassuring.  Appears mildly dehydrated clinically.  Reevaluation with update and discussion with patient and granddaughter at bedside.  She is feeling much better after IV fluids.  We discussed labs along with close PCP follow-up plan. Discussed strict ED return precautions.   Considered admission workup here is reassuring, improved with IV fluids, stable for discharge.  Patient's presentation is most consistent with acute presentation with potential threat to life or bodily function.   Disposition: discharge  ____________________________________________  FINAL CLINICAL IMPRESSION(S) / ED DIAGNOSES  Final diagnoses:  Syncope and collapse  Injury of head, initial encounter  Contusion of scalp, initial encounter    Note:  This document was prepared using Dragon voice recognition software and may include unintentional dictation errors.  Nanda Quinton, MD, [facility]    Jaine Estabrooks, [facility], MD 06/22/22 [phone]

## 2022-06-22 NOTE — ED Notes (Signed)
Pt verbally understood discharge instructions

## 2022-06-22 NOTE — ED Notes (Signed)
Patient transported to CT 

## 2022-06-26 ENCOUNTER — Telehealth: Payer: Self-pay

## 2022-06-26 NOTE — Telephone Encounter (Signed)
skilled nursing facility

## 2022-06-28 ENCOUNTER — Non-Acute Institutional Stay: Payer: Medicare Other | Admitting: Internal Medicine

## 2022-06-28 ENCOUNTER — Encounter: Payer: Self-pay | Admitting: Internal Medicine

## 2022-06-28 VITALS — BP 130/86 | HR 86 | Temp 97.7°F | Resp 17 | Ht 61.0 in | Wt 138.0 lb

## 2022-06-28 DIAGNOSIS — H6121 Impacted cerumen, right ear: Secondary | ICD-10-CM | POA: Diagnosis not present

## 2022-06-28 DIAGNOSIS — E785 Hyperlipidemia, unspecified: Secondary | ICD-10-CM | POA: Diagnosis not present

## 2022-06-28 DIAGNOSIS — R55 Syncope and collapse: Secondary | ICD-10-CM

## 2022-06-28 DIAGNOSIS — IMO0002 Reserved for concepts with insufficient information to code with codable children: Secondary | ICD-10-CM

## 2022-06-28 MED ORDER — SIMVASTATIN 20 MG PO TABS: 20.0000 mg | ORAL_TABLET | Freq: Every day | ORAL | 3 refills | Status: DC

## 2022-06-28 NOTE — Progress Notes (Signed)
Location: [facility] (12)  Provider:   Code Status:  Goals of Care:     06/28/2022    8:53 AM  Advanced Directives  Does Patient Have a Medical Advance Directive? Yes  Type of Advance Directive Out of facility DNR (pink MOST or yellow form);Living will;Healthcare Power of Attorney  Does patient want to make changes to medical advance directive? No - Patient declined  Copy of [place] in Chart? No - copy requested  Would patient like information on creating a medical advance directive? No - Guardian declined     Chief Complaint  Patient presents with   Acute Visit    Patient is being seen for a fall after getting up and hit her head    HPI: Patient is a 88 y.o. female seen today for an acute visit for Follow up from the fall and Sycopal episode  Lives in [place]  Patient was Near the reception desk in [facility] when she stood up to talk to someone and felt dizzy and passed out hitting her head to the floor Went to ED where CT scan showed right posterior cephalohematoma but no other acute issues.  Her labs showed mildly elevated BUN.  .It was thought syncope was possible due to dehydration. Since then patient has been feeling good has not felt dizzy.  She does complain of some pain and tenderness in the right side of her head.  Other issues Right Ear Clogged with ? Wax Had tried Debrox It helped some but still feels there is something there  Had Right Shoulder Arthroplasty but it does not have much improvement     History reviewed. No pertinent past medical history.  Past Surgical History:  Procedure Laterality Date   BREAST BIOPSY Right 2014    Allergies  Allergen Reactions   Chlorine Other (See Comments)    Sinus drainage    Outpatient Encounter Medications as of 06/28/2022  Medication Sig   B Complex-C-Folic Acid (RENAL) 1 MG CAPS Take 1 capsule by mouth.   Cholecalciferol 100 MCG  (4000 UT) CAPS Take 1 capsule by mouth.   latanoprost (XALATAN) 0.005 % ophthalmic solution Place 1 drop into both eyes at bedtime.   Multiple Vitamin (MULTIVITAMIN) capsule Take 1 capsule by mouth daily.   Multiple Vitamins-Minerals (PRESERVISION AREDS 2) CAPS Take by mouth.   Omega-3 1000 MG CAPS Take 1 capsule by mouth daily.   simvastatin (ZOCOR) 20 MG tablet Take 20 mg by mouth at bedtime.   TURMERIC CURCUMIN PO by Misc.(Non-Drug; Combo Route) route.   No facility-administered encounter medications on file as of 06/28/2022.    Review of Systems:  Review of Systems  Constitutional:  Negative for activity change and appetite change.  HENT: Negative.    Respiratory:  Negative for cough and shortness of breath.   Cardiovascular:  Negative for leg swelling.  Gastrointestinal:  Negative for constipation.  Genitourinary: Negative.   Musculoskeletal:  Negative for arthralgias, gait problem and myalgias.  Skin: Negative.   Neurological:  Negative for dizziness and weakness.  Psychiatric/Behavioral:  Negative for confusion, dysphoric mood and sleep disturbance.     Health Maintenance  Topic Date Due   DEXA SCAN  Never done   Medicare Annual Wellness (AWV)  04/22/2021   COVID-19 Vaccine (7 - 2023-24 season) 07/13/2022 (Originally 05/09/2022)   Pneumonia Vaccine 51+ Years old (1 of 1 - PCV) 05/18/2023 (Originally 07/02/2001)  INFLUENZA VACCINE  Completed   Zoster Vaccines- Shingrix  Completed   HPV VACCINES  Aged Out   DTaP/Tdap/Td  Discontinued    Physical Exam: Vitals:   06/28/22 0849  BP: 130/86  Pulse: 86  Resp: 17  Temp: 97.7 F (36.5 C)  TempSrc: Temporal  SpO2: 94%  Weight: 138 lb (62.6 kg)  Height: 5' 1"$  (1.549 m)   Body mass index is 26.07 kg/m. Physical Exam Vitals reviewed.  Constitutional:      Appearance: Normal appearance.  HENT:     Head: Normocephalic.     Nose: Nose normal.     Mouth/Throat:     Mouth: Mucous membranes are moist.     Pharynx:  Oropharynx is clear.  Eyes:     Pupils: Pupils are equal, round, and reactive to light.  Cardiovascular:     Rate and Rhythm: Normal rate and regular rhythm.     Pulses: Normal pulses.     Heart sounds: Normal heart sounds. No murmur heard. Pulmonary:     Effort: Pulmonary effort is normal.     Breath sounds: Normal breath sounds.  Abdominal:     General: Abdomen is flat. Bowel sounds are normal.     Palpations: Abdomen is soft.  Musculoskeletal:        General: No swelling.     Cervical back: Neck supple.  Skin:    General: Skin is warm.  Neurological:     General: No focal deficit present.     Mental Status: She is alert and oriented to person, place, and time.     Comments: Felt slightly Unstable when she walks  Psychiatric:        Mood and Affect: Mood normal.        Thought Content: Thought content normal.     Labs reviewed: Basic Metabolic Panel: Recent Labs    05/22/22 0810 05/22/22 0821 06/22/22 1526  NA  --  140 138  K  --  4.6 4.2  CL  --  101 102  CO2  --  31 27  GLUCOSE  --  83 122*  BUN  --  15 25*  CREATININE  --  0.76 0.98  CALCIUM  --  9.7 9.5  TSH 3.64  --   --    Liver Function Tests: Recent Labs    05/22/22 0821  AST 22  ALT 20  BILITOT 0.8  PROT 7.2   No results for input(s): "LIPASE", "AMYLASE" in the last 8760 hours. No results for input(s): "AMMONIA" in the last 8760 hours. CBC: Recent Labs    05/22/22 0804 06/22/22 1526  WBC 6.3 10.5  NEUTROABS 3,969 8.6*  HGB 15.8* 15.0  HCT 46.2* 45.3  MCV 90.2 90.8  PLT 251 235   Lipid Panel: Recent Labs    05/22/22 0815  CHOL 165  HDL 69  LDLCALC 79  TRIG 90  CHOLHDL 2.4   No results found for: "HGBA1C"  Procedures since last visit: CT Head Wo Contrast  Result Date: 06/22/2022 CLINICAL DATA:  Head trauma. EXAM: CT HEAD WITHOUT CONTRAST TECHNIQUE: Contiguous axial images were obtained from the base of the skull through the vertex without intravenous contrast. RADIATION DOSE  REDUCTION: This exam was performed according to the departmental dose-optimization program which includes automated exposure control, adjustment of the mA and/or kV according to patient size and/or use of iterative reconstruction technique. COMPARISON:  None Available. FINDINGS: Brain: There is periventricular white matter decreased attenuation consistent with small vessel  ischemic changes. Ventricles, sulci and cisterns are prominent consistent with age related involutional changes. No acute intracranial hemorrhage, mass effect or shift. No hydrocephalus. Vascular: No hyperdense vessel or unexpected calcification. Skull: Normal. Negative for fracture or focal lesion. Right posterior parietal subcutaneous hyperdensity consistent with cephalohematoma. Sinuses/Orbits: No acute finding. IMPRESSION: 1. Atrophy and chronic small vessel ischemic changes. 2. Right posterior parietal cephalohematoma. 3. No acute intracranial process identified. Electronically Signed   By: Sammie Bench M.D.   On: 06/22/2022 15:29    Assessment/Plan 1. Syncope, unspecified syncope type Exam was normal today Consider using Cane   2. Cephalohematoma Stable  3. Hyperlipidemia, unspecified hyperlipidemia type Pravachol renewed  4. Impacted cerumen of right ear Still waiting for ENT Appointment    Labs/tests ordered:  * No order type specified * Next appt:  11/06/2022

## 2022-06-30 DIAGNOSIS — L602 Onychogryphosis: Secondary | ICD-10-CM | POA: Diagnosis not present

## 2022-06-30 DIAGNOSIS — M79671 Pain in right foot: Secondary | ICD-10-CM | POA: Diagnosis not present

## 2022-06-30 DIAGNOSIS — M79672 Pain in left foot: Secondary | ICD-10-CM | POA: Diagnosis not present

## 2022-06-30 DIAGNOSIS — L84 Corns and callosities: Secondary | ICD-10-CM | POA: Diagnosis not present

## 2022-07-12 DIAGNOSIS — H43812 Vitreous degeneration, left eye: Secondary | ICD-10-CM | POA: Diagnosis not present

## 2022-07-12 DIAGNOSIS — H524 Presbyopia: Secondary | ICD-10-CM | POA: Diagnosis not present

## 2022-07-12 DIAGNOSIS — H353121 Nonexudative age-related macular degeneration, left eye, early dry stage: Secondary | ICD-10-CM | POA: Diagnosis not present

## 2022-07-12 DIAGNOSIS — Z961 Presence of intraocular lens: Secondary | ICD-10-CM | POA: Diagnosis not present

## 2022-07-12 DIAGNOSIS — H40013 Open angle with borderline findings, low risk, bilateral: Secondary | ICD-10-CM | POA: Diagnosis not present

## 2022-07-28 DIAGNOSIS — K080 Exfoliation of teeth due to systemic causes: Secondary | ICD-10-CM | POA: Diagnosis not present

## 2022-08-30 ENCOUNTER — Encounter: Payer: Self-pay | Admitting: Internal Medicine

## 2022-08-30 ENCOUNTER — Non-Acute Institutional Stay: Payer: Medicare Other | Admitting: Internal Medicine

## 2022-08-30 VITALS — BP 128/82 | HR 68 | Temp 97.7°F | Resp 17 | Ht 61.0 in | Wt 137.7 lb

## 2022-08-30 DIAGNOSIS — H6121 Impacted cerumen, right ear: Secondary | ICD-10-CM | POA: Diagnosis not present

## 2022-08-30 DIAGNOSIS — R55 Syncope and collapse: Secondary | ICD-10-CM

## 2022-08-30 DIAGNOSIS — M2041 Other hammer toe(s) (acquired), right foot: Secondary | ICD-10-CM | POA: Diagnosis not present

## 2022-08-30 DIAGNOSIS — E785 Hyperlipidemia, unspecified: Secondary | ICD-10-CM

## 2022-08-30 DIAGNOSIS — H353121 Nonexudative age-related macular degeneration, left eye, early dry stage: Secondary | ICD-10-CM | POA: Diagnosis not present

## 2022-08-30 DIAGNOSIS — M2042 Other hammer toe(s) (acquired), left foot: Secondary | ICD-10-CM

## 2022-08-30 NOTE — Progress Notes (Signed)
Location:  [facility] (12)  Provider:   Code Status:  Goals of Care:     08/30/2022   10:55 AM  Advanced Directives  Does Patient Have a Medical Advance Directive? Yes  Type of Estate agent of [place];Living will;Out of facility DNR (pink MOST or yellow form)  Does patient want to make changes to medical advance directive? No - Patient declined  Copy of Healthcare Power of Attorney in Chart? No - copy requested     Chief Complaint  Patient presents with   Medical Management of Chronic Issues    Follow up   Health Maintenance    Patient is due for Bone density and AWV it has been scheduled     HPI: Patient is a 88 y.o. female seen today for medical management of chronic diseases.    Lives in [facility] [place]  Recent Move  h/o HLD, and right shoulder arthroplasty .  Patient has a history of episode of syncope in 02/24 Her workup was negative and it was thought to be due to dehydration Patient states that she has been very careful since then and trying to drink water and has not had any fall or any dizziness No other complaints today History reviewed. No pertinent past medical history.  Past Surgical History:  Procedure Laterality Date   BREAST BIOPSY Right 2014    Allergies  Allergen Reactions   Chlorine Other (See Comments)    Sinus drainage    Outpatient Encounter Medications as of 08/30/2022  Medication Sig   B Complex-C-Folic Acid (RENAL) 1 MG CAPS Take 1 capsule by mouth.   Cholecalciferol 100 MCG (4000 UT) CAPS Take 1 capsule by mouth.   GARLIC PO Take by mouth.   Ginger, Zingiber officinalis, (GINGER EXTRACT PO) by miscellaneous route.   Multiple Vitamin (MULTIVITAMIN) capsule Take 1 capsule by mouth daily.   Multiple Vitamins-Minerals (PRESERVISION AREDS 2) CAPS Take by mouth.   Omega-3 1000 MG CAPS Take 1 capsule by mouth daily.   Probiotic, Lactobacillus, CAPS Take by mouth.    simvastatin (ZOCOR) 20 MG tablet Take 1 tablet (20 mg total) by mouth at bedtime.   TURMERIC CURCUMIN PO by Misc.(Non-Drug; Combo Route) route.   latanoprost (XALATAN) 0.005 % ophthalmic solution Place 1 drop into both eyes at bedtime. (Patient not taking: Reported on 08/30/2022)   No facility-administered encounter medications on file as of 08/30/2022.    Review of Systems:  Review of Systems  Constitutional:  Negative for activity change and appetite change.  HENT: Negative.    Respiratory:  Negative for cough and shortness of breath.   Cardiovascular:  Negative for leg swelling.  Gastrointestinal:  Negative for constipation.  Genitourinary: Negative.   Musculoskeletal:  Negative for arthralgias, gait problem and myalgias.  Skin: Negative.   Neurological:  Negative for dizziness and weakness.  Psychiatric/Behavioral:  Negative for confusion, dysphoric mood and sleep disturbance.     Health Maintenance  Topic Date Due   DEXA SCAN  Never done   Medicare Annual Wellness (AWV)  04/22/2021   COVID-19 Vaccine (7 - 2023-24 season) 09/15/2022 (Originally 05/09/2022)   INFLUENZA VACCINE  12/07/2022   Pneumonia Vaccine 62+ Years old  Completed   Zoster Vaccines- Shingrix  Completed   HPV VACCINES  Aged Out   DTaP/Tdap/Td  Discontinued    Physical Exam: Vitals:   08/30/22 1103  BP: 128/82  Pulse: 68  Resp: 17  Temp:  97.7 F (36.5 C)  TempSrc: Temporal  SpO2: 96%  Weight: 137 lb 11.2 oz (62.5 kg)  Height:  (1.549 m)   Body mass index is 26.02 kg/m. Physical Exam Vitals reviewed.  Constitutional:      Appearance: Normal appearance.  HENT:     Head: Normocephalic.     Nose: Nose normal.     Mouth/Throat:     Mouth: Mucous membranes are moist.     Pharynx: Oropharynx is clear.  Eyes:     Pupils: Pupils are equal, round, and reactive to light.  Cardiovascular:     Rate and Rhythm: Normal rate and regular rhythm.     Pulses: Normal pulses.     Heart sounds: Normal  heart sounds. No murmur heard. Pulmonary:     Effort: Pulmonary effort is normal.     Breath sounds: Normal breath sounds.  Abdominal:     General: Abdomen is flat. Bowel sounds are normal.     Palpations: Abdomen is soft.  Musculoskeletal:        General: No swelling.     Cervical back: Neck supple.  Skin:    General: Skin is warm.  Neurological:     General: No focal deficit present.     Mental Status: She is alert and oriented to person, place, and time.  Psychiatric:        Mood and Affect: Mood normal.        Thought Content: Thought content normal.     Labs reviewed: Basic Metabolic Panel: Recent Labs    05/22/22 0810 05/22/22 0821 06/22/22 1526  NA  --  140 138  K  --  4.6 4.2  CL  --  101 102  CO2  --  31 27  GLUCOSE  --  83 122*  BUN  --  15 25*  CREATININE  --  0.76 0.98  CALCIUM  --  9.7 9.5  TSH 3.64  --   --    Liver Function Tests: Recent Labs    05/22/22 0821  AST 22  ALT 20  BILITOT 0.8  PROT 7.2   No results for input(s): "LIPASE", "AMYLASE" in the last 8760 hours. No results for input(s): "AMMONIA" in the last 8760 hours. CBC: Recent Labs    05/22/22 0804 06/22/22 1526  WBC 6.3 10.5  NEUTROABS 3,969 8.6*  HGB 15.8* 15.0  HCT 46.2* 45.3  MCV 90.2 90.8  PLT 251 235   Lipid Panel: Recent Labs    05/22/22 0815  CHOL 165  HDL 69  LDLCALC 79  TRIG 90  CHOLHDL 2.4   No results found for: "HGBA1C"  Procedures since last visit: No results found.  Assessment/Plan 1. Hyperlipidemia, unspecified hyperlipidemia type  - TSH - Lipid panel - COMPLETE METABOLIC PANEL WITH GFR - CBC with Differential/Platelet  2. Impacted cerumen of right ear With able to get it clean with an hearing  3. Hammer toes of both feet Does not want surgery as suggested by podiatry  4. 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 Syncope, unspecified syncope type Thought to be due to dehydration No falls  recently    Labs/tests ordered:  * No order type specified * Next appt:  10/16/2022

## 2022-09-26 DIAGNOSIS — L814 Other melanin hyperpigmentation: Secondary | ICD-10-CM | POA: Diagnosis not present

## 2022-09-26 DIAGNOSIS — L821 Other seborrheic keratosis: Secondary | ICD-10-CM | POA: Diagnosis not present

## 2022-09-26 DIAGNOSIS — L853 Xerosis cutis: Secondary | ICD-10-CM | POA: Diagnosis not present

## 2022-09-26 DIAGNOSIS — L820 Inflamed seborrheic keratosis: Secondary | ICD-10-CM | POA: Diagnosis not present

## 2022-10-16 ENCOUNTER — Non-Acute Institutional Stay (INDEPENDENT_AMBULATORY_CARE_PROVIDER_SITE_OTHER): Payer: Medicare Other | Admitting: Orthopedic Surgery

## 2022-10-16 ENCOUNTER — Encounter: Payer: Self-pay | Admitting: Orthopedic Surgery

## 2022-10-16 VITALS — BP 124/88 | HR 103 | Temp 97.4°F | Resp 16 | Ht 61.0 in | Wt 138.3 lb

## 2022-10-16 DIAGNOSIS — Z0000 Encounter for general adult medical examination without abnormal findings: Secondary | ICD-10-CM

## 2022-10-16 NOTE — Progress Notes (Signed)
Subjective:   Sherry Padilla is a 88 y.o. female who presents for Medicare Annual (Subsequent) preventive examination.  Place of Service: [facility] [place] clinic Provider: Hazle Nordmann, AGNP-C   Review of Systems     Cardiac Risk Factors include: advanced age (>65men, >65 women)     Objective:    Today's Vitals   10/16/22 1306  BP: 124/88  Pulse: (!) 103  Resp: 16  Temp: (!) 97.4 F (36.3 C)  SpO2: 96%  Weight: 138 lb 4.8 oz (62.7 kg)  Height: 5\' 1"  (1.549 m)   Body mass index is 26.13 kg/m.     10/16/2022    1:28 PM 08/30/2022   10:55 AM 06/28/2022    8:53 AM 06/22/2022    2:44 PM 05/17/2022   10:25 AM  Advanced Directives  Does Patient Have a Medical Advance Directive? Yes Yes Yes Yes Yes  Type of Estate agent of [place];Living will;Out of facility DNR (pink MOST or yellow form) Healthcare Power of [place];Living will;Out of facility DNR (pink MOST or yellow form) Out of facility DNR (pink MOST or yellow form);Living will;Healthcare Power of [employer] of [place];Living will Out of facility DNR (pink MOST or yellow form);Living will;Healthcare Power of Attorney  Does patient want to make changes to medical advance directive? No - Patient declined No - Patient declined No - Patient declined    Copy of Healthcare Power of Attorney in Chart? Yes - validated most recent copy scanned in chart (See row information) No - copy requested No - copy requested  No - copy requested  Would patient like information on creating a medical advance directive?   No - Guardian declined  No - Guardian declined    Current Medications (verified) Outpatient Encounter Medications as of 10/16/2022  Medication Sig   Ascorbic Acid (VITAMIN C PO) Take 1 tablet by mouth daily.   B Complex-C-Folic Acid (RENAL) 1 MG CAPS Take 1 capsule by mouth.   Cholecalciferol 100 MCG (4000 UT) CAPS Take 1 capsule by mouth.   GARLIC PO Take by mouth.   Ginger, Zingiber  officinalis, (GINGER EXTRACT PO) by miscellaneous route.   Multiple Vitamin (MULTIVITAMIN) capsule Take 1 capsule by mouth daily.   Multiple Vitamins-Minerals (PRESERVISION AREDS 2) CAPS Take by mouth.   Multiple Vitamins-Minerals (ZINC PO) Take 1 tablet by mouth daily.   Omega-3 1000 MG CAPS Take 1 capsule by mouth daily.   Probiotic, Lactobacillus, CAPS Take by mouth.   simvastatin (ZOCOR) 20 MG tablet Take 1 tablet (20 mg total) by mouth at bedtime.   TURMERIC CURCUMIN PO by Misc.(Non-Drug; Combo Route) route.   VITAMIN E PO Take 1 tablet by mouth daily.   [DISCONTINUED] latanoprost (XALATAN) 0.005 % ophthalmic solution Place 1 drop into both eyes at bedtime. (Patient not taking: Reported on 08/30/2022)   No facility-administered encounter medications on file as of 10/16/2022.    Allergies (verified) Chlorine   History: History reviewed. No pertinent past medical history. Past Surgical History:  Procedure Laterality Date   BREAST BIOPSY Right 2014   History reviewed. No pertinent family history. Social History   Socioeconomic History   Marital status: Widowed    Spouse name: Not on file   Number of children: Not on file   Years of education: Not on file   Highest education level: Not on file  Occupational History   Not on file  Tobacco Use   Smoking status: Never   Smokeless tobacco: Never  Substance and Sexual Activity   Alcohol use: Not Currently   Drug use: Never   Sexual activity: Not Currently  Other Topics Concern   Not on file  Social History Narrative   Not on file   Social Determinants of Health   Financial Resource Strain: Low Risk  (10/16/2022)   Overall Financial Resource Strain (CARDIA)    Difficulty of Paying Living Expenses: Not hard at all  Food Insecurity: No Food Insecurity (10/16/2022)   Hunger Vital Sign    Worried About Running Out of Food in the Last Year: Never true    Ran Out of Food in the Last Year: Never true  Transportation Needs: No  Transportation Needs (10/16/2022)   PRAPARE - Administrator, Civil Service (Medical): No    Lack of Transportation (Non-Medical): No  Physical Activity: Sufficiently Active (10/16/2022)   Exercise Vital Sign    Days of Exercise per Week: 7 days    Minutes of Exercise per Session: 50 min  Stress: No Stress Concern Present (10/16/2022)   [employer] of Occupational Health - Occupational Stress Questionnaire    Feeling of Stress : Only a little  Social Connections: Moderately Isolated (10/16/2022)   Social Connection and Isolation Panel [NHANES]    Frequency of Communication with Friends and Family: More than three times a week    Frequency of Social Gatherings with Friends and Family: Once a week    Attends Religious Services: 1 to 4 times per year    Active Member of [employer] or Organizations: No    Attends Banker Meetings: Never    Marital Status: Widowed    Tobacco Counseling Counseling given: Not Answered   Clinical Intake:  Pre-visit preparation completed: No  Pain : No/denies pain     BMI - recorded: 26.13 Nutritional Status: BMI 25 -29 Overweight Nutritional Risks: None Diabetes: No  How often do you need to have someone help you when you read instructions, pamphlets, or other written materials from your doctor or pharmacy?: 1 - Never What is the last grade level you completed in school?: Masters degree  Diabetic?No  Interpreter Needed?: No      Activities of Daily Living    10/16/2022    1:39 PM  In your present state of health, do you have any difficulty performing the following activities:  Hearing? 0  Vision? 0  Difficulty concentrating or making decisions? 0  Walking or climbing stairs? 0  Dressing or bathing? 0  Doing errands, shopping? 1  Comment does not drive  Preparing Food and eating ? N  Using the Toilet? N  In the past six months, have you accidently leaked urine? N  Do you have problems with loss of bowel  control? N  Managing your Medications? N  Managing your Finances? N  Housekeeping or managing your Housekeeping? N    Patient Care Team: Mahlon Gammon, MD as PCP - General (Internal Medicine)  Indicate any recent Medical Services you may have received from other than [facility] providers in the past year (date may be approximate).     Assessment:   This is a routine wellness examination for [place].  Hearing/Vision screen Hearing Screening - Comments:: No hearing concerns. Vision Screening - Comments:: No vision concerns. Patient wears reading glasses. Patient last eye exam 2024.  Dietary issues and exercise activities discussed: Current Exercise Habits: Structured exercise class, Type of exercise: strength training/weights;treadmill;walking;Other - see comments (swimming), Time (Minutes): 50, Frequency (Times/Week): 7, Weekly  Exercise (Minutes/Week): 350, Intensity: Moderate, Exercise limited by: orthopedic condition(s)   Goals Addressed             This Visit's Progress    Maintain Mobility and Function   On track    Evidence-based guidance:  Acknowledge and validate impact of pain, loss of strength and potential disfigurement (hand osteoarthritis) on mental health and daily life, such as social isolation, anxiety, depression, impaired sexual relationship and   injury from falls.  Anticipate referral to physical or occupational therapy for assessment, therapeutic exercise and recommendation for adaptive equipment or assistive devices; encourage participation.  Assess impact on ability to perform activities of daily living, as well as engage in sports and leisure events or requirements of work or school.  Provide anticipatory guidance and reassurance about the benefit of exercise to maintain function; acknowledge and normalize fear that exercise may worsen symptoms.  Encourage regular exercise, at least 10 minutes at a time for 45 minutes per week; consider yoga, water exercise and  proprioceptive exercises; encourage use of wearable activity tracker to increase motivation and adherence.  Encourage maintenance or resumption of daily activities, including employment, as pain allows and with minimal exposure to trauma.  Assist patient to advocate for adaptations to the work environment.  Consider level of pain and function, gender, age, lifestyle, patient preference, quality of life, readiness and ?ocapacity to benefit? when recommending patients for orthopaedic surgery consultation.  Explore strategies, such as changes to medication regimen or activity that enables patient to anticipate and manage flare-ups that increase deconditioning and disability.  Explore patient preferences; encourage exposure to a broader range of activities that have been avoided for fear of experiencing pain.  Identify barriers to participation in therapy or exercise, such as pain with activity, anticipated or imagined pain.  Monitor postoperative joint replacement or any preexisting joint replacement for ongoing pain and loss of function; provide social support and encouragement throughout recovery.   Notes:        Depression Screen    10/16/2022    1:18 PM 05/17/2022   10:25 AM  PHQ 2/9 Scores  PHQ - 2 Score 0 0    Fall Risk    10/16/2022    1:39 PM 10/16/2022    1:17 PM 08/30/2022   10:53 AM 06/28/2022    8:53 AM 05/17/2022   10:25 AM  Fall Risk   Falls in the past year? 1 1 1 1  0  Number falls in past yr: 0 0 0 0 0  Injury with Fall? 0 1 1 1  0  Risk for fall due to : History of fall(s);Impaired balance/gait History of fall(s);Impaired balance/gait;Impaired mobility History of fall(s) History of fall(s) No Fall Risks  Follow up Falls evaluation completed;Education provided;Falls prevention discussed Falls evaluation completed;Education provided;Falls prevention discussed Falls evaluation completed Falls evaluation completed Falls evaluation completed    FALL RISK PREVENTION PERTAINING  TO THE HOME:  Any stairs in or around the home? No  If so, are there any without handrails? No  Home free of loose throw rugs in walkways, pet beds, electrical cords, etc? Yes  Adequate lighting in your home to reduce risk of falls? Yes   ASSISTIVE DEVICES UTILIZED TO PREVENT FALLS:  Life alert? No  Use of a cane, walker or w/c? No  Grab bars in the bathroom? Yes  Shower chair or bench in shower? Yes  Elevated toilet seat or a handicapped toilet? Yes   TIMED UP AND GO:  Was the test performed?  No .  Length of time to ambulate 10 feet: N/A sec.   Gait slow and steady without use of assistive device  Cognitive Function:    10/16/2022    1:23 PM  MMSE - Mini Mental State Exam  Orientation to time 5  Orientation to Place 5  Registration 3  Attention/ Calculation 5  Recall 3  Language- name 2 objects 2  Language- repeat 1  Language- follow 3 step command 3  Language- read & follow direction 1  Write a sentence 1  Copy design 0  Total score 29        Immunizations Immunization History  Administered Date(s) Administered   Fluad Quad(high Dose 65+) 02/05/2022   Influenza, High Dose Seasonal PF 01/26/2017, 02/01/2018, 01/17/2019, 02/19/2020, 01/18/2021   Influenza-Unspecified 01/19/2016, 01/18/2021   Moderna Covid-19 Vaccine Bivalent Booster 66yrs & up 05/27/2021, 03/14/2022   Moderna SARS-COV2 Booster Vaccination 09/28/2020   Moderna Sars-Covid-2 Vaccination 05/20/2019, 06/17/2019, 01/27/2020   Pneumococcal Conjugate-13 06/01/2014   Pneumococcal Polysaccharide-23 03/09/2003   Tdap 10/07/2013   Zoster Recombinat (Shingrix) 01/03/2018, 03/07/2018   Zoster, Live 07/26/2007   Zoster, Unspecified 03/07/2018    TDAP status: Up to date  Flu Vaccine status: Up to date  Pneumococcal vaccine status: Up to date  Covid-19 vaccine status: Completed vaccines  Qualifies for Shingles Vaccine? Yes   Zostavax completed Yes   Shingrix Completed?: Yes  Screening  Tests Health Maintenance  Topic Date Due   DEXA SCAN  Never done   COVID-19 Vaccine (7 - 2023-24 season) 05/09/2022   INFLUENZA VACCINE  12/07/2022   Medicare Annual Wellness (AWV)  10/16/2023   Pneumonia Vaccine 46+ Years old  Completed   Zoster Vaccines- Shingrix  Completed   HPV VACCINES  Aged Out   DTaP/Tdap/Td  Discontinued    Health Maintenance  Health Maintenance Due  Topic Date Due   DEXA SCAN  Never done   COVID-19 Vaccine (7 - 2023-24 season) 05/09/2022    Colorectal cancer screening: No longer required.   Mammogram status: No longer required due to advanced age.  Bone Density status: Completed cannot recall date. Results reflect: Bone density results: OSTEOPENIA. Repeat every refusing to have future encounters, since she would not do anything about it years.  Lung Cancer Screening: (Low Dose CT Chest recommended if Age 20-80 years, 30 pack-year currently smoking OR have quit w/in 15years.) does not qualify.   Lung Cancer Screening Referral: No  Additional Screening:  Hepatitis C Screening: does not qualify; Completed   Vision Screening: Recommended annual ophthalmology exams for early detection of glaucoma and other disorders of the eye. Is the patient up to date with their annual eye exam?  Yes  Who is the provider or what is the name of the office in which the patient attends annual eye exams? Dr. Sherrine Maples If pt is not established with a provider, would they like to be referred to a provider to establish care? No .   Dental Screening: Recommended annual dental exams for proper oral hygiene  Community Resource Referral / Chronic Care Management: CRR required this visit?  No   CCM required this visit?  No      Plan:     I have personally reviewed and noted the following in the patient's chart:   Medical and social history Use of alcohol, tobacco or illicit drugs  Current medications and supplements including opioid prescriptions. Patient is not  currently taking opioid prescriptions. Functional ability and status Nutritional status Physical activity Advanced  directives List of other physicians Hospitalizations, surgeries, and ER visits in previous 12 months Vitals Screenings to include cognitive, depression, and falls Referrals and appointments  In addition, I have reviewed and discussed with patient certain preventive protocols, quality metrics, and best practice recommendations. A written personalized care plan for preventive services as well as general preventive health recommendations were provided to patient.     Octavia Heir, NP   10/16/2022   Nurse Notes: Refusing future DEXA scans since she would not start treatment

## 2022-10-16 NOTE — Patient Instructions (Addendum)
  Ms. Novell , Thank you for taking time to come for your Medicare Wellness Visit. I appreciate your ongoing commitment to your health goals. Please review the following plan we discussed and let me know if I can assist you in the future.   These are the goals we discussed:  Goals      Maintain Mobility and Function     Evidence-based guidance:  Acknowledge and validate impact of pain, loss of strength and potential disfigurement (hand osteoarthritis) on mental health and daily life, such as social isolation, anxiety, depression, impaired sexual relationship and   injury from falls.  Anticipate referral to physical or occupational therapy for assessment, therapeutic exercise and recommendation for adaptive equipment or assistive devices; encourage participation.  Assess impact on ability to perform activities of daily living, as well as engage in sports and leisure events or requirements of work or school.  Provide anticipatory guidance and reassurance about the benefit of exercise to maintain function; acknowledge and normalize fear that exercise may worsen symptoms.  Encourage regular exercise, at least 10 minutes at a time for 45 minutes per week; consider yoga, water exercise and proprioceptive exercises; encourage use of wearable activity tracker to increase motivation and adherence.  Encourage maintenance or resumption of daily activities, including employment, as pain allows and with minimal exposure to trauma.  Assist patient to advocate for adaptations to the work environment.  Consider level of pain and function, gender, age, lifestyle, patient preference, quality of life, readiness and ?ocapacity to benefit? when recommending patients for orthopaedic surgery consultation.  Explore strategies, such as changes to medication regimen or activity that enables patient to anticipate and manage flare-ups that increase deconditioning and disability.  Explore patient preferences; encourage  exposure to a broader range of activities that have been avoided for fear of experiencing pain.  Identify barriers to participation in therapy or exercise, such as pain with activity, anticipated or imagined pain.  Monitor postoperative joint replacement or any preexisting joint replacement for ongoing pain and loss of function; provide social support and encouragement throughout recovery.   Notes:         This is a list of the screening recommended for you and due dates:  Health Maintenance  Topic Date Due   COVID-19 Vaccine (7 - 2023-24 season) 11/01/2022*   Flu Shot  12/07/2022   Medicare Annual Wellness Visit  10/16/2023   Pneumonia Vaccine  Completed   Zoster (Shingles) Vaccine  Completed   HPV Vaccine  Aged Out   DTaP/Tdap/Td vaccine  Discontinued   DEXA scan (bone density measurement)  Discontinued  *Topic was postponed. The date shown is not the original due date.    Refusing future DEXA scans since she would not start treatment

## 2022-10-25 ENCOUNTER — Encounter: Payer: Self-pay | Admitting: Internal Medicine

## 2022-10-25 ENCOUNTER — Non-Acute Institutional Stay: Payer: Medicare Other | Admitting: Internal Medicine

## 2022-10-25 DIAGNOSIS — Z7189 Other specified counseling: Secondary | ICD-10-CM | POA: Diagnosis not present

## 2022-10-25 NOTE — Progress Notes (Signed)
Location: [facility] curator of Service:  Clinic (12)  Provider:   Code Status: Full code  Goals of Care:     10/25/2022   10:25 AM  Advanced Directives  Does Patient Have a Medical Advance Directive? Yes  Type of Estate agent of [place];Living will;Out of facility DNR (pink MOST or yellow form)  Does patient want to make changes to medical advance directive? No - Patient declined  Copy of Healthcare Power of Attorney in Chart? Yes - validated most recent copy scanned in chart (See row information)     Chief Complaint  Patient presents with  . Acute Visit    Patient is being seen for a acute visit     HPI: Patient is a 88 y.o. female seen today for an acute visit for Discussion for Goals of care and DNR Form  Lives in [facility] [place]  Recent Move  h/o HLD, and right shoulder arthroplasty .  Patient has a history of episode of syncope in 02/24 Her workup was negative and it was thought to be due to dehydration  She wanted to talk about DNR  Renette Butters Form They had meeting [facility] and they are suppose to talk to their PCP to fill up the form   History reviewed. No pertinent past medical history.  Past Surgical History:  Procedure Laterality Date  . BREAST BIOPSY Right 2014    Allergies  Allergen Reactions  . Chlorine Other (See Comments)    Sinus drainage    Outpatient Encounter Medications as of 10/25/2022  Medication Sig  . Ascorbic Acid (VITAMIN C PO) Take 1 tablet by mouth daily.  . B Complex-C-Folic Acid (RENAL) 1 MG CAPS Take 1 capsule by mouth.  . Cholecalciferol 100 MCG (4000 UT) CAPS Take 1 capsule by mouth.  Marland Kitchen GARLIC PO Take by mouth.  . Ginger, Zingiber officinalis, (GINGER EXTRACT PO) by miscellaneous route.  . Multiple Vitamin (MULTIVITAMIN) capsule Take 1 capsule by mouth daily.  . Multiple Vitamins-Minerals (PRESERVISION AREDS 2) CAPS Take by mouth.  . Multiple Vitamins-Minerals (ZINC PO) Take 1 tablet  by mouth daily.  . Omega-3 1000 MG CAPS Take 1 capsule by mouth daily.  . Probiotic, Lactobacillus, CAPS Take by mouth.  . simvastatin (ZOCOR) 20 MG tablet Take 1 tablet (20 mg total) by mouth at bedtime.  . TURMERIC CURCUMIN PO by Misc.(Non-Drug; Combo Route) route.  Marland Kitchen VITAMIN E PO Take 1 tablet by mouth daily.   No facility-administered encounter medications on file as of 10/25/2022.    Review of Systems:  Review of Systems  Health Maintenance  Topic Date Due  . COVID-19 Vaccine (7 - 2023-24 season) 11/01/2022 (Originally 05/09/2022)  . INFLUENZA VACCINE  12/07/2022  . Medicare Annual Wellness (AWV)  10/16/2023  . Pneumonia Vaccine 15+ Years old  Completed  . Zoster Vaccines- Shingrix  Completed  . HPV VACCINES  Aged Out  . DTaP/Tdap/Td  Discontinued  . DEXA SCAN  Discontinued    Physical Exam: Vitals:   10/25/22 1023  BP: 130/78  Pulse: 91  Resp: 17  Temp: 97.8 F (36.6 C)  TempSrc: Temporal  SpO2: 98%  Weight: 137 lb (62.1 kg)  Height: 5\' 1"  (1.549 m)   Body mass index is 25.89 kg/m. Physical Exam Vitals reviewed.  Constitutional:      Appearance: Normal appearance.  HENT:     Head: Normocephalic.     Nose: Nose normal.     Mouth/Throat:  Mouth: Mucous membranes are moist.  Eyes:     Pupils: Pupils are equal, round, and reactive to light.  Cardiovascular:     Rate and Rhythm: Normal rate.  Musculoskeletal:     Cervical back: Neck supple.  Neurological:     General: No focal deficit present.     Mental Status: She is alert and oriented to person, place, and time.  Psychiatric:        Mood and Affect: Mood normal.        Thought Content: Thought content normal.    Labs reviewed: Basic Metabolic Panel: Recent Labs    05/22/22 0810 05/22/22 0821 06/22/22 1526  NA  --  140 138  K  --  4.6 4.2  CL  --  101 102  CO2  --  31 27  GLUCOSE  --  83 122*  BUN  --  15 25*  CREATININE  --  0.76 0.98  CALCIUM  --  9.7 9.5  TSH 3.64  --   --    Liver  Function Tests: Recent Labs    05/22/22 0821  AST 22  ALT 20  BILITOT 0.8  PROT 7.2   No results for input(s): "LIPASE", "AMYLASE" in the last 8760 hours. No results for input(s): "AMMONIA" in the last 8760 hours. CBC: Recent Labs    05/22/22 0804 06/22/22 1526  WBC 6.3 10.5  NEUTROABS 3,969 8.6*  HGB 15.8* 15.0  HCT 46.2* 45.3  MCV 90.2 90.8  PLT 251 235   Lipid Panel: Recent Labs    05/22/22 0815  CHOL 165  HDL 69  LDLCALC 79  TRIG 90  CHOLHDL 2.4   No results found for: "HGBA1C"  Procedures since last visit: No results found.  Assessment/Plan 1. Counseling regarding advance care planning and goals of care After discussion with patient she wants to stay Full code right now She does want CPR. But says she does not want to live if she will need Artifical support with Ventilation and Feeding Tube I told her to talk to her POA who is her grand daughter and let her know her wishes Her only son lives in [place]      Labs/tests ordered:   Next appt:  03/01/2023

## 2022-11-06 ENCOUNTER — Other Ambulatory Visit: Payer: Medicare Other

## 2022-11-13 ENCOUNTER — Other Ambulatory Visit: Payer: Medicare Other

## 2022-11-13 DIAGNOSIS — E785 Hyperlipidemia, unspecified: Secondary | ICD-10-CM | POA: Diagnosis not present

## 2022-11-14 LAB — LIPID PANEL
Cholesterol: 173 mg/dL (ref <200)
HDL: 67 mg/dL (ref >=50)
LDL Cholesterol (Calc): 86 mg/dL (calc)
Non-HDL Cholesterol (Calc): 106 mg/dL (calc) (ref <130)
Total CHOL/HDL Ratio: 2.6 (calc) (ref <5.0)
Triglycerides: 100 mg/dL (ref <150)

## 2022-11-14 LAB — CBC WITH DIFFERENTIAL/PLATELET
Absolute Monocytes: 616 cells/uL (ref 200–950)
Basophils Absolute: 28 cells/uL (ref 0–200)
Basophils Relative: 0.4 %
Eosinophils Absolute: 112 cells/uL (ref 15–500)
Eosinophils Relative: 1.6 %
HCT: 45.6 % — ABNORMAL HIGH (ref 35.0–45.0)
Hemoglobin: 15.1 g/dL (ref 11.7–15.5)
Lymphs Abs: 1540 cells/uL (ref 850–3900)
MCH: 30.1 pg (ref 27.0–33.0)
MCHC: 33.1 g/dL (ref 32.0–36.0)
MCV: 91 fL (ref 80.0–100.0)
MPV: 10.9 fL (ref 7.5–12.5)
Monocytes Relative: 8.8 %
Neutro Abs: 4704 cells/uL (ref 1500–7800)
Neutrophils Relative %: 67.2 %
Platelets: 241 10*3/uL (ref 140–400)
RBC: 5.01 10*6/uL (ref 3.80–5.10)
RDW: 13.6 % (ref 11.0–15.0)
Total Lymphocyte: 22 %
WBC: 7 10*3/uL (ref 3.8–10.8)

## 2022-11-14 LAB — COMPLETE METABOLIC PANEL WITH GFR
AG Ratio: 1.8 (calc) (ref 1.0–2.5)
ALT: 16 U/L (ref 6–29)
AST: 19 U/L (ref 10–35)
Albumin: 4.6 g/dL (ref 3.6–5.1)
Alkaline phosphatase (APISO): 91 U/L (ref 37–153)
BUN: 18 mg/dL (ref 7–25)
CO2: 29 mmol/L (ref 20–32)
Calcium: 9.4 mg/dL (ref 8.6–10.4)
Chloride: 103 mmol/L (ref 98–110)
Creat: 0.7 mg/dL (ref 0.60–0.95)
Globulin: 2.5 g/dL (calc) (ref 1.9–3.7)
Glucose, Bld: 81 mg/dL (ref 65–99)
Potassium: 4.1 mmol/L (ref 3.5–5.3)
Sodium: 140 mmol/L (ref 135–146)
Total Bilirubin: 0.9 mg/dL (ref 0.2–1.2)
Total Protein: 7.1 g/dL (ref 6.1–8.1)
eGFR: 84 mL/min/{1.73_m2} (ref >=60)

## 2022-11-14 LAB — TSH: TSH: 3.08 mIU/L (ref 0.40–4.50)

## 2022-11-15 ENCOUNTER — Encounter: Payer: Medicare Other | Admitting: Internal Medicine

## 2022-12-01 DIAGNOSIS — K080 Exfoliation of teeth due to systemic causes: Secondary | ICD-10-CM | POA: Diagnosis not present

## 2023-01-17 ENCOUNTER — Other Ambulatory Visit: Payer: Self-pay

## 2023-01-17 DIAGNOSIS — E785 Hyperlipidemia, unspecified: Secondary | ICD-10-CM

## 2023-01-17 MED ORDER — SIMVASTATIN 20 MG PO TABS: 20.0000 mg | ORAL_TABLET | Freq: Every day | ORAL | 3 refills | Status: DC

## 2023-01-26 DIAGNOSIS — K080 Exfoliation of teeth due to systemic causes: Secondary | ICD-10-CM | POA: Diagnosis not present

## 2023-02-28 ENCOUNTER — Other Ambulatory Visit: Payer: Self-pay | Admitting: Internal Medicine

## 2023-02-28 DIAGNOSIS — R55 Syncope and collapse: Secondary | ICD-10-CM

## 2023-02-28 DIAGNOSIS — E785 Hyperlipidemia, unspecified: Secondary | ICD-10-CM

## 2023-03-01 ENCOUNTER — Other Ambulatory Visit: Payer: Medicare Other

## 2023-03-01 DIAGNOSIS — R55 Syncope and collapse: Secondary | ICD-10-CM | POA: Diagnosis not present

## 2023-03-01 DIAGNOSIS — E785 Hyperlipidemia, unspecified: Secondary | ICD-10-CM | POA: Diagnosis not present

## 2023-03-02 LAB — COMPLETE METABOLIC PANEL WITH GFR
AG Ratio: 1.9 (calc) (ref 1.0–2.5)
ALT: 17 U/L (ref 6–29)
AST: 17 U/L (ref 10–35)
Albumin: 4.3 g/dL (ref 3.6–5.1)
Alkaline phosphatase (APISO): 100 U/L (ref 37–153)
BUN: 15 mg/dL (ref 7–25)
CO2: 28 mmol/L (ref 20–32)
Calcium: 9.4 mg/dL (ref 8.6–10.4)
Chloride: 104 mmol/L (ref 98–110)
Creat: 0.73 mg/dL (ref 0.60–0.95)
Globulin: 2.3 g/dL (ref 1.9–3.7)
Glucose, Bld: 91 mg/dL (ref 65–99)
Potassium: 4.3 mmol/L (ref 3.5–5.3)
Sodium: 142 mmol/L (ref 135–146)
Total Bilirubin: 0.6 mg/dL (ref 0.2–1.2)
Total Protein: 6.6 g/dL (ref 6.1–8.1)
eGFR: 80 mL/min/{1.73_m2} (ref >=60)

## 2023-03-02 LAB — CBC WITH DIFFERENTIAL/PLATELET
Absolute Lymphocytes: 1277 {cells}/uL (ref 850–3900)
Absolute Monocytes: 608 {cells}/uL (ref 200–950)
Basophils Absolute: 31 {cells}/uL (ref 0–200)
Basophils Relative: 0.5 %
Eosinophils Absolute: 118 {cells}/uL (ref 15–500)
Eosinophils Relative: 1.9 %
HCT: 45.2 % — ABNORMAL HIGH (ref 35.0–45.0)
Hemoglobin: 14.9 g/dL (ref 11.7–15.5)
MCH: 30 pg (ref 27.0–33.0)
MCHC: 33 g/dL (ref 32.0–36.0)
MCV: 91.1 fL (ref 80.0–100.0)
MPV: 11 fL (ref 7.5–12.5)
Monocytes Relative: 9.8 %
Neutro Abs: 4166 {cells}/uL (ref 1500–7800)
Neutrophils Relative %: 67.2 %
Platelets: 248 10*3/uL (ref 140–400)
RBC: 4.96 10*6/uL (ref 3.80–5.10)
RDW: 12.9 % (ref 11.0–15.0)
Total Lymphocyte: 20.6 %
WBC: 6.2 10*3/uL (ref 3.8–10.8)

## 2023-03-02 LAB — LIPID PANEL
Cholesterol: 168 mg/dL (ref <200)
HDL: 59 mg/dL (ref >=50)
LDL Cholesterol (Calc): 89 mg/dL
Non-HDL Cholesterol (Calc): 109 mg/dL (ref <130)
Total CHOL/HDL Ratio: 2.8 (calc) (ref <5.0)
Triglycerides: 104 mg/dL (ref <150)

## 2023-03-07 ENCOUNTER — Encounter: Payer: Self-pay | Admitting: Internal Medicine

## 2023-03-07 ENCOUNTER — Non-Acute Institutional Stay: Payer: Medicare Other | Admitting: Internal Medicine

## 2023-03-07 VITALS — BP 116/72 | HR 101 | Temp 97.6°F | Resp 16 | Ht 61.0 in | Wt 136.6 lb

## 2023-03-07 DIAGNOSIS — E785 Hyperlipidemia, unspecified: Secondary | ICD-10-CM

## 2023-03-07 DIAGNOSIS — M2042 Other hammer toe(s) (acquired), left foot: Secondary | ICD-10-CM

## 2023-03-07 DIAGNOSIS — M2041 Other hammer toe(s) (acquired), right foot: Secondary | ICD-10-CM | POA: Diagnosis not present

## 2023-03-07 NOTE — Progress Notes (Signed)
Location:  [facility] of Service:  Clinic (12)  Provider:   Code Status: Full Code  Goals of Care:     03/07/2023   11:09 AM  Advanced Directives  Does Patient Have a Medical Advance Directive? Yes  Type of Estate agent of [place];Living will  Does patient want to make changes to medical advance directive? No - Patient declined  Copy of Healthcare Power of Attorney in Chart? No - copy requested     Chief Complaint  Patient presents with   Medical Management of Chronic Issues    Patient states she is here for 6 month follow up and lab     HPI: Patient is a 88 y.o. female seen today for medical management of chronic diseases.   Lives in [facility] [place]    h/o HLD, and right shoulder arthroplasty .  Patient has a history of episode of syncope in 02/24 Her workup was negative and it was thought to be due to dehydration  She is doing well No Acute issues today  History reviewed. No pertinent past medical history.  Past Surgical History:  Procedure Laterality Date   BREAST BIOPSY Right 2014   Right Shoulder Arthroplasty      Allergies  Allergen Reactions   Chlorine Other (See Comments)    Sinus drainage    Outpatient Encounter Medications as of 03/07/2023  Medication Sig   Ascorbic Acid (VITAMIN C PO) Take 1 tablet by mouth daily.   B Complex-C-Folic Acid (RENAL) 1 MG CAPS Take 1 capsule by mouth.   Cholecalciferol 100 MCG (4000 UT) CAPS Take 1 capsule by mouth.   GARLIC PO Take by mouth.   Ginger, Zingiber officinalis, (GINGER EXTRACT PO) by miscellaneous route.   Multiple Vitamin (MULTIVITAMIN) capsule Take 1 capsule by mouth daily.   Multiple Vitamins-Minerals (PRESERVISION AREDS 2) CAPS Take by mouth.   Multiple Vitamins-Minerals (ZINC PO) Take 1 tablet by mouth daily.   Omega-3 1000 MG CAPS Take 1 capsule by mouth daily.   Probiotic, Lactobacillus, CAPS Take by mouth.   simvastatin (ZOCOR) 20 MG tablet Take 1  tablet (20 mg total) by mouth at bedtime.   TURMERIC CURCUMIN PO by Misc.(Non-Drug; Combo Route) route.   VITAMIN E PO Take 1 tablet by mouth daily.   No facility-administered encounter medications on file as of 03/07/2023.    Review of Systems:  Review of Systems  Constitutional:  Negative for activity change and appetite change.  HENT: Negative.    Respiratory:  Negative for cough and shortness of breath.   Cardiovascular:  Negative for leg swelling.  Gastrointestinal:  Negative for constipation.  Genitourinary: Negative.   Musculoskeletal:  Negative for arthralgias, gait problem and myalgias.  Skin: Negative.   Neurological:  Negative for dizziness and weakness.  Psychiatric/Behavioral:  Negative for confusion, dysphoric mood and sleep disturbance.     Health Maintenance  Topic Date Due   COVID-19 Vaccine (7 - 2023-24 season) 01/07/2023   Medicare Annual Wellness (AWV)  10/16/2023   Pneumonia Vaccine 34+ Years old  Completed   INFLUENZA VACCINE  Completed   Zoster Vaccines- Shingrix  Completed   HPV VACCINES  Aged Out   DTaP/Tdap/Td  Discontinued   DEXA SCAN  Discontinued    Physical Exam: Vitals:   03/07/23 1107  BP: 116/72  Pulse: (!) 101  Resp: 16  Temp: 97.6 F (36.4 C)  TempSrc: Temporal  SpO2: 95%  Weight: 136 lb 9.6 oz (  62 kg)  Height: 5\' 1"  (1.549 m)   Body mass index is 25.81 kg/m. Physical Exam Vitals reviewed.  Constitutional:      Appearance: Normal appearance.  HENT:     Head: Normocephalic.     Right Ear: Tympanic membrane normal.     Left Ear: Tympanic membrane normal.     Ears:     Comments: Small Wax amount    Nose: Nose normal.     Mouth/Throat:     Mouth: Mucous membranes are moist.     Pharynx: Oropharynx is clear.  Eyes:     Pupils: Pupils are equal, round, and reactive to light.  Cardiovascular:     Rate and Rhythm: Normal rate and regular rhythm.     Pulses: Normal pulses.     Heart sounds: Normal heart sounds. No murmur  heard. Pulmonary:     Effort: Pulmonary effort is normal.     Breath sounds: Normal breath sounds.  Abdominal:     General: Abdomen is flat. Bowel sounds are normal.     Palpations: Abdomen is soft.  Musculoskeletal:        General: No swelling.     Cervical back: Neck supple.  Skin:    General: Skin is warm.  Neurological:     General: No focal deficit present.     Mental Status: She is alert and oriented to person, place, and time.  Psychiatric:        Mood and Affect: Mood normal.        Thought Content: Thought content normal.     Labs reviewed: Basic Metabolic Panel: Recent Labs    05/22/22 0810 05/22/22 0821 06/22/22 1526 11/13/22 0800 03/01/23 0810  NA  --    < > 138 140 142  K  --    < > 4.2 4.1 4.3  CL  --    < > 102 103 104  CO2  --    < > 27 29 28   GLUCOSE  --    < > 122* 81 91  BUN  --    < > 25* 18 15  CREATININE  --    < > 0.98 0.70 0.73  CALCIUM  --    < > 9.5 9.4 9.4  TSH 3.64  --   --  3.08  --    < > = values in this interval not displayed.   Liver Function Tests: Recent Labs    05/22/22 0821 11/13/22 0800 03/01/23 0810  AST 22 19 17   ALT 20 16 17   BILITOT 0.8 0.9 0.6  PROT 7.2 7.1 6.6   No results for input(s): "LIPASE", "AMYLASE" in the last 8760 hours. No results for input(s): "AMMONIA" in the last 8760 hours. CBC: Recent Labs    06/22/22 1526 11/13/22 0800 03/01/23 0810  WBC 10.5 7.0 6.2  NEUTROABS 8.6* 4,704 4,166  HGB 15.0 15.1 14.9  HCT 45.3 45.6* 45.2*  MCV 90.8 91.0 91.1  PLT 235 241 248   Lipid Panel: Recent Labs    05/22/22 0815 11/13/22 0800 03/01/23 0810  CHOL 165 173 168  HDL 69 67 59  LDLCALC 79 86 89  TRIG 90 100 104  CHOLHDL 2.4 2.6 2.8   No results found for: "HGBA1C"  Procedures since last visit: No results found.  Assessment/Plan 1. Hyperlipidemia, unspecified hyperlipidemia type On statin  2. 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

## 2023-04-18 ENCOUNTER — Other Ambulatory Visit: Payer: Self-pay | Admitting: Internal Medicine

## 2023-04-18 DIAGNOSIS — E785 Hyperlipidemia, unspecified: Secondary | ICD-10-CM

## 2023-04-18 MED ORDER — SIMVASTATIN 20 MG PO TABS: 20.0000 mg | ORAL_TABLET | Freq: Every day | ORAL | 3 refills | Status: DC

## 2023-04-25 ENCOUNTER — Encounter: Payer: Medicare Other | Admitting: Internal Medicine

## 2023-07-04 ENCOUNTER — Other Ambulatory Visit: Payer: Self-pay | Admitting: Internal Medicine

## 2023-07-04 DIAGNOSIS — E785 Hyperlipidemia, unspecified: Secondary | ICD-10-CM

## 2023-07-04 MED ORDER — SIMVASTATIN 20 MG PO TABS: 20.0000 mg | ORAL_TABLET | Freq: Every day | ORAL | 3 refills | Status: DC

## 2023-07-16 DIAGNOSIS — H43812 Vitreous degeneration, left eye: Secondary | ICD-10-CM | POA: Diagnosis not present

## 2023-07-16 DIAGNOSIS — Z961 Presence of intraocular lens: Secondary | ICD-10-CM | POA: Diagnosis not present

## 2023-07-16 DIAGNOSIS — H353121 Nonexudative age-related macular degeneration, left eye, early dry stage: Secondary | ICD-10-CM | POA: Diagnosis not present

## 2023-07-16 DIAGNOSIS — H40013 Open angle with borderline findings, low risk, bilateral: Secondary | ICD-10-CM | POA: Diagnosis not present

## 2023-08-31 LAB — LIPID PANEL
Cholesterol: 155 mg/dL (ref <200)
HDL: 60 mg/dL (ref >=50)
LDL Cholesterol (Calc): 79 mg/dL
Non-HDL Cholesterol (Calc): 95 mg/dL (ref <130)
Total CHOL/HDL Ratio: 2.6 (calc) (ref <5.0)
Triglycerides: 80 mg/dL (ref <150)

## 2023-08-31 LAB — CBC WITH DIFFERENTIAL/PLATELET
Absolute Lymphocytes: 1462 {cells}/uL (ref 850–3900)
Absolute Monocytes: 691 {cells}/uL (ref 200–950)
Basophils Absolute: 29 {cells}/uL (ref 0–200)
Basophils Relative: 0.4 %
Eosinophils Absolute: 130 {cells}/uL (ref 15–500)
Eosinophils Relative: 1.8 %
HCT: 47.6 % — ABNORMAL HIGH (ref 35.0–45.0)
Hemoglobin: 15.5 g/dL (ref 11.7–15.5)
MCH: 29.9 pg (ref 27.0–33.0)
MCHC: 32.6 g/dL (ref 32.0–36.0)
MCV: 91.7 fL (ref 80.0–100.0)
MPV: 10.9 fL (ref 7.5–12.5)
Monocytes Relative: 9.6 %
Neutro Abs: 4889 {cells}/uL (ref 1500–7800)
Neutrophils Relative %: 67.9 %
Platelets: 221 10*3/uL (ref 140–400)
RBC: 5.19 10*6/uL — ABNORMAL HIGH (ref 3.80–5.10)
RDW: 13.2 % (ref 11.0–15.0)
Total Lymphocyte: 20.3 %
WBC: 7.2 10*3/uL (ref 3.8–10.8)

## 2023-08-31 LAB — COMPREHENSIVE METABOLIC PANEL WITH GFR
AG Ratio: 1.9 (calc) (ref 1.0–2.5)
ALT: 17 U/L (ref 6–29)
AST: 19 U/L (ref 10–35)
Albumin: 4.4 g/dL (ref 3.6–5.1)
Alkaline phosphatase (APISO): 87 U/L (ref 37–153)
BUN: 21 mg/dL (ref 7–25)
CO2: 30 mmol/L (ref 20–32)
Calcium: 9.3 mg/dL (ref 8.6–10.4)
Chloride: 102 mmol/L (ref 98–110)
Creat: 0.75 mg/dL (ref 0.60–0.95)
Globulin: 2.3 g/dL (ref 1.9–3.7)
Glucose, Bld: 85 mg/dL (ref 65–99)
Potassium: 4.4 mmol/L (ref 3.5–5.3)
Sodium: 138 mmol/L (ref 135–146)
Total Bilirubin: 0.8 mg/dL (ref 0.2–1.2)
Total Protein: 6.7 g/dL (ref 6.1–8.1)
eGFR: 77 mL/min/{1.73_m2} (ref >=60)

## 2023-08-31 LAB — TSH: TSH: 1.84 m[IU]/L (ref 0.40–4.50)

## 2023-09-05 ENCOUNTER — Non-Acute Institutional Stay: Payer: Medicare Other | Admitting: Internal Medicine

## 2023-09-05 ENCOUNTER — Encounter: Payer: Self-pay | Admitting: Internal Medicine

## 2023-09-05 VITALS — BP 116/77 | HR 88 | Temp 97.3°F | Resp 18 | Ht 61.0 in | Wt 141.7 lb

## 2023-09-05 DIAGNOSIS — E785 Hyperlipidemia, unspecified: Secondary | ICD-10-CM

## 2023-09-05 DIAGNOSIS — R718 Other abnormality of red blood cells: Secondary | ICD-10-CM | POA: Diagnosis not present

## 2023-09-05 NOTE — Progress Notes (Signed)
 [facility]:  [facility] Service:  Cl[facility] (12)  Provider:   Code Status: Full Code Goals of Care:     09/05/2023   11:15 AM  Advanced Directives  Does Patient Have a Medical Advance Directive? No  Would patient like information on creating a medical advance directive? No - Patient declined     Chief Complaint  Patient presents with   Medical Management of Chronic Issues     6 month follow up     HPI: Patient is a 89 y.o. female seen today for medical management of chronic diseases.    Lives in [facility] [place]  Recent Move  h/o HLD, and right shoulder arthroplasty .  Patient has a history of episode of syncope in 02/24 Her workup was negative and it was thought to be due to dehydration  No Complains today Grand daughter with her today Had no new issues No falls or dizziness Continues ot be Active  History reviewed. No pertinent past medical history.  Past Surgical History:  Procedure Laterality Date   BREAST BIOPSY Right 2014   Right Shoulder Arthroplasty      Allergies  Allergen Reactions   Chlorine Other (See Comments)    Sinus drainage    Outpatient Encounter Medications as of 09/05/2023  Medication Sig   Ascorbic Acid (VITAMIN C PO) Take 1 tablet by mouth daily.   B Complex-C-Folic Acid (RENAL) 1 MG CAPS Take 1 capsule by mouth.   CALCIUM PO Take by mouth daily.   Cholecalciferol 100 MCG (4000 UT) CAPS Take 1 capsule by mouth.   GARLIC PO Take by mouth.   Ginger, Zingiber officinalis, (GINGER EXTRACT PO) by miscellaneous route.   Multiple Vitamin (MULTIVITAMIN) capsule Take 1 capsule by mouth daily.   Multiple Vitamins-Minerals (PRESERVISION AREDS 2) CAPS Take by mouth.   Multiple Vitamins-Minerals (ZINC PO) Take 1 tablet by mouth daily.   Omega-3 1000 MG CAPS Take 1 capsule by mouth daily.   Probiotic, Lactobacillus, CAPS Take by mouth.   simvastatin  (ZOCOR ) 20 MG tablet Take 1 tablet (20 mg total) by mouth at bedtime.    TURMERIC CURCUMIN PO by Misc.(Non-Drug; Combo Route) route.   VITAMIN E PO Take 1 tablet by mouth daily.   No facility-administered encounter medications on file as of 09/05/2023.    Review of Systems:  Review of Systems  Constitutional:  Negative for activity change and appetite change.  HENT: Negative.    Respiratory:  Negative for cough and shortness of breath.   Cardiovascular:  Negative for leg swelling.  Gastrointestinal:  Negative for constipation.  Genitourinary:  Positive for frequency and urgency.  Musculoskeletal:  Negative for arthralgias, gait problem and myalgias.  Skin: Negative.   Neurological:  Negative for dizziness and weakness.  Psychiatric/Behavioral:  Negative for confusion, dysphoric mood and sleep disturbance.     Health Maintenance  Topic Date Due   COVID-19 Vaccine (7 - 2024-25 season) 01/07/2023   Medicare Annual Wellness (AWV)  10/16/2023   INFLUENZA VACCINE  12/07/2023   Pneumonia Vaccine 58+ Years old  Completed   Zoster Vaccines- Shingrix  Completed   HPV VACCINES  Aged Out   Meningococcal B Vaccine  Aged Out   DTaP/Tdap/Td  Discontinued   DEXA SCAN  Discontinued    Physical Exam: Vitals:   09/05/23 1055  BP: 116/77  Pulse: 88  Resp: 18  Temp: (!) 97.3 F (36.3 C)  TempSrc: Temporal  SpO2: 94%  Weight: 141  lb 11.2 oz (64.3 kg)  Height: 5\' 1"  (1.549 m)   Body mass index is 26.77 kg/m. Physical Exam Vitals reviewed.  Constitutional:      Appearance: Normal appearance.  HENT:     Head: Normocephalic.     Right Ear: Tympanic membrane normal.     Left Ear: Tympanic membrane normal.     Nose: Nose normal.     Mouth/Throat:     Mouth: Mucous membranes are moist.     Pharynx: Oropharynx is clear.  Eyes:     Pupils: Pupils are equal, round, and reactive to light.  Cardiovascular:     Rate and Rhythm: Normal rate and regular rhythm.     Pulses: Normal pulses.     Heart sounds: Normal heart sounds. No murmur heard. Pulmonary:      Effort: Pulmonary effort is normal.     Breath sounds: Normal breath sounds.  Abdominal:     General: Abdomen is flat. Bowel sounds are normal.     Palpations: Abdomen is soft.  Musculoskeletal:        General: No swelling.     Cervical back: Neck supple.     Comments: Has restricted Movement in her Right shoulder  Skin:    General: Skin is warm.  Neurological:     General: No focal deficit present.     Mental Status: She is alert and oriented to person, place, and time.  Psychiatric:        Mood and Affect: Mood normal.        Thought Content: Thought content normal.     Labs reviewed: Basic Metabolic Panel: Recent Labs    11/13/22 0800 03/01/23 0810 08/30/23 0807  NA 140 142 138  K 4.1 4.3 4.4  CL 103 104 102  CO2 29 28 30   GLUCOSE 81 91 85  BUN 18 15 21   CREATININE 0.70 0.73 0.75  CALCIUM 9.4 9.4 9.3  TSH 3.08  --  1.84   Liver Function Tests: Recent Labs    11/13/22 0800 03/01/23 0810 08/30/23 0807  AST 19 17 19   ALT 16 17 17   BILITOT 0.9 0.6 0.8  PROT 7.1 6.6 6.7   No results for input(s): "LIPASE", "AMYLASE" in the last 8760 hours. No results for input(s): "AMMONIA" in the last 8760 hours. CBC: Recent Labs    11/13/22 0800 03/01/23 0810 08/30/23 0807  WBC 7.0 6.2 7.2  NEUTROABS 4,704 4,166 4,889  HGB 15.1 14.9 15.5  HCT 45.6* 45.2* 47.6*  MCV 91.0 91.1 91.7  PLT 241 248 221   Lipid Panel: Recent Labs    11/13/22 0800 03/01/23 0810 08/30/23 0807  CHOL 173 168 155  HDL 67 59 60  LDLCALC 86 89 79  TRIG 100 104 80  CHOLHDL 2.6 2.8 2.6   No results found for: "HGBA1C"  Procedures since last visit: No results found.  Assessment/Plan 1. Hyperlipidemia, unspecified hyperlipidemia type (Primary) On statin  2. Elevated hematocrit Repeat CBC in 3 months  Encourage Po fluids  3 Early dry stage nonexudative age-related macular degeneration of left eye  Follows with Dr Burnadette [facility] On AREDS 4 . Hammer toes of both feet Does not want  surgery as suggested by podiatry   Labs/tests ordered:   Next appt:  Visit date not found

## 2023-09-05 NOTE — Patient Instructions (Signed)
 July 28 th your labs will done at [facility] at 7:45 am

## 2023-10-24 ENCOUNTER — Other Ambulatory Visit: Payer: Self-pay | Admitting: Internal Medicine

## 2023-10-24 DIAGNOSIS — E785 Hyperlipidemia, unspecified: Secondary | ICD-10-CM

## 2023-10-24 MED ORDER — SIMVASTATIN 20 MG PO TABS: 20.0000 mg | ORAL_TABLET | Freq: Every day | ORAL | 3 refills | Status: DC

## 2023-11-12 ENCOUNTER — Ambulatory Visit: Payer: Self-pay | Admitting: *Deleted

## 2023-11-12 NOTE — Telephone Encounter (Signed)
 Copied from CRM [id number]. Topic: Clinical - Red Word Triage >> Nov 12, 2023 10:19 AM Alfonso ORN wrote: Red Word that prompted transfer to Nurse Triage:  yesterday on toliet for 2 hours having a bowment with blood ,when wipe self there was blood , and water was red , no strain or pain , have not happen since yesterday   pt. call back # [phone] Reason for Disposition  MODERATE rectal bleeding (small blood clots, passing blood without stool, or toilet water turns red)  Answer Assessment - Initial Assessment Questions 1. APPEARANCE of BLOOD: What color is it? Is it passed separately, on the surface of the stool, or mixed in with the stool?      Yesterday after having normal BM I was cleaning myself and noticed blood this was for 2 hours.  I didn't leave the toilet.   No pain or straining.  No diarrhea.    When I left the toilet after using a whole roll of toilet paper the blood had stopped.   In the toilet water it was bright red. 3:00 PM yesterday.  No blood since then.   I have not eaten anything.   I'm afraid to eat.   So last night I had sour dough bread.   This morning I've had 2 glasses of water and sour dough bread.   I'm afraid to eat.   I don't want to start bleeding again.  No abd pain. 2. AMOUNT: How much blood was passed?      A lot.    The toilet water was bring red. 3. FREQUENCY: How many times has blood been passed with the stools?      Just once on Sunday.  4. ONSET: When was the blood first seen in the stools? (Days or weeks)      Yesterday 11/11/2023.   Just that one time. 5. DIARRHEA: Is there also some diarrhea? If Yes, ask: How many diarrhea stools in the past 24 hours?      No 6. CONSTIPATION: Do you have constipation? If Yes, ask: How bad is it?     No   No straining. 7. RECURRENT SYMPTOMS: Have you had blood in your stools before? If Yes, ask: When was the last time? and What happened that time?      No 8. BLOOD THINNERS: Do you take any blood  thinners? (e.g., Coumadin/warfarin, Pradaxa/dabigatran, aspirin)     No 9. OTHER SYMPTOMS: Do you have any other symptoms?  (e.g., abdomen pain, vomiting, dizziness, fever)     No   No dizziness 10. PREGNANCY: Is there any chance you are pregnant? When was your last menstrual period?       N/A due to age  Protocols used: Rectal Bleeding-A-AH FYI Only or Action Required?: FYI only for provider.  Patient was last seen in primary care on 09/05/2023 by Charlanne Fredia CROME, MD. Called Nurse Triage reporting Rectal Bleeding. Symptoms began yesterday. Interventions attempted: Nothing. Symptoms are: stable  Rectal bleeding on Sunday one time.   None since.  .  Triage Disposition: See Physician Within 24 Hours  Patient/caregiver understands and will follow disposition?: Yes Instructed to go to the ED if this happens again.  Pt agreeable to this plan.

## 2023-11-12 NOTE — Telephone Encounter (Signed)
 Appointment scheduled with Dr. Charlanne for 11/14/2023 at [facility]

## 2023-11-14 ENCOUNTER — Encounter: Payer: Self-pay | Admitting: Internal Medicine

## 2023-11-14 ENCOUNTER — Ambulatory Visit: Admitting: Internal Medicine

## 2023-11-14 VITALS — BP 110/78 | HR 83 | Temp 97.6°F | Ht 61.0 in | Wt 139.3 lb

## 2023-11-14 DIAGNOSIS — E785 Hyperlipidemia, unspecified: Secondary | ICD-10-CM

## 2023-11-14 DIAGNOSIS — R718 Other abnormality of red blood cells: Secondary | ICD-10-CM | POA: Diagnosis not present

## 2023-11-14 DIAGNOSIS — K625 Hemorrhage of anus and rectum: Secondary | ICD-10-CM | POA: Diagnosis not present

## 2023-11-14 MED ORDER — HYDROCORTISONE ACETATE 25 MG RE SUPP: 25.0000 mg | Freq: Every evening | RECTAL | 0 refills | Status: AC | PRN

## 2023-11-14 NOTE — Progress Notes (Signed)
 Location:  [facility] of Service:  Clinic   Provider:   Code Status:  Goals of Care:     09/05/2023   11:15 AM  Advanced Directives  Does Patient Have a Medical Advance Directive? No  Would patient like information on creating a medical advance directive? No - Patient declined     Chief Complaint  Patient presents with   Rectal Bleeding    Rectal bleeding on Sunday used a whole roll of toilet paper. The toilet water was bright red too. No stools or bleeding since. Instructed to go to the ED if it happens again. RN Triage. Due for Covid vaccine and annual wellness visit.    HPI: Patient is a 89 y.o. female seen today for an acute visit for Rectal Bleeding  Discussed the use of AI scribe software for clinical note transcription with the patient, who gave verbal consent to proceed.  History of Present Illness   Sherry Padilla is an 89 year old female who presents with rectal bleeding. She is accompanied by her granddaughter.  She experienced a single episode of bright red rectal bleeding on Sunday afternoon following a bowel movement, which ceased after sitting for a while. She has since been cautious with her diet, eating less to prevent recurrence. Her bowel movements have been regular, with no history of constipation. She did not have bowel movements on Monday and Tuesday, likely due to reduced food intake and increased water consumption. On Wednesday, she had a normal bowel movement without blood.  There is no abdominal pain, nausea, or vomiting. She has not been diagnosed with hemorrhoids and has not had similar bleeding episodes in the past, though she occasionally noticed minor blood streaks in her stool. Her last colonoscopy was in March 2020. She has stopped taking all vitamins.        History reviewed. No pertinent past medical history.  Past Surgical History:  Procedure Laterality Date   BREAST BIOPSY Right 2014   Right Shoulder Arthroplasty       Allergies  Allergen Reactions   Chlorine Other (See Comments)    Sinus drainage    Outpatient Encounter Medications as of 11/14/2023  Medication Sig   Ascorbic Acid (VITAMIN C PO) Take 1 tablet by mouth daily.   B Complex-C-Folic Acid (RENAL) 1 MG CAPS Take 1 capsule by mouth.   CALCIUM PO Take by mouth daily.   Cholecalciferol 100 MCG (4000 UT) CAPS Take 1 capsule by mouth.   GARLIC PO Take by mouth.   Ginger, Zingiber officinalis, (GINGER EXTRACT PO) by miscellaneous route.   hydrocortisone  (ANUSOL -HC) 25 MG suppository Place 1 suppository (25 mg total) rectally at bedtime as needed for hemorrhoids or anal itching.   Multiple Vitamin (MULTIVITAMIN) capsule Take 1 capsule by mouth daily.   Multiple Vitamins-Minerals (PRESERVISION AREDS 2) CAPS Take by mouth.   Multiple Vitamins-Minerals (ZINC PO) Take 1 tablet by mouth daily.   Omega-3 1000 MG CAPS Take 1 capsule by mouth daily.   Probiotic, Lactobacillus, CAPS Take by mouth.   simvastatin  (ZOCOR ) 20 MG tablet Take 1 tablet (20 mg total) by mouth at bedtime.   TURMERIC CURCUMIN PO by Misc.(Non-Drug; Combo Route) route.   VITAMIN E PO Take 1 tablet by mouth daily.   No facility-administered encounter medications on file as of 11/14/2023.    Review of Systems:  Review of Systems  Constitutional:  Negative for activity change and appetite change.  HENT: Negative.    Respiratory:  Negative for cough and shortness of breath.   Cardiovascular:  Negative for leg swelling.  Gastrointestinal:  Positive for blood in stool. Negative for constipation.  Genitourinary: Negative.   Musculoskeletal:  Negative for arthralgias, gait problem and myalgias.  Skin: Negative.   Neurological:  Negative for dizziness and weakness.  Psychiatric/Behavioral:  Negative for confusion, dysphoric mood and sleep disturbance.     Health Maintenance  Topic Date Due   COVID-19 Vaccine (7 - 2024-25 season) 01/07/2023   Medicare Annual Wellness (AWV)   10/16/2023   INFLUENZA VACCINE  12/07/2023   Pneumococcal Vaccine: 50+ Years  Completed   Zoster Vaccines- Shingrix  Completed   Hepatitis B Vaccines  Aged Out   HPV VACCINES  Aged Out   Meningococcal B Vaccine  Aged Out   DTaP/Tdap/Td  Discontinued   DEXA SCAN  Discontinued    Physical Exam: Vitals:   11/14/23 0815  BP: 110/78  Pulse: 83  Temp: 97.6 F (36.4 C)  SpO2: 98%  Weight: 139 lb 4.8 oz (63.2 kg)  Height: 5' 1 (1.549 m)   Body mass index is 26.32 kg/m. Physical Exam Vitals reviewed.  Constitutional:      Appearance: Normal appearance.  HENT:     Head: Normocephalic.     Nose: Nose normal.     Mouth/Throat:     Mouth: Mucous membranes are moist.     Pharynx: Oropharynx is clear.  Eyes:     Pupils: Pupils are equal, round, and reactive to light.  Cardiovascular:     Rate and Rhythm: Normal rate and regular rhythm.     Pulses: Normal pulses.     Heart sounds: Normal heart sounds. No murmur heard. Pulmonary:     Effort: Pulmonary effort is normal.     Breath sounds: Normal breath sounds.  Abdominal:     General: Abdomen is flat. Bowel sounds are normal.     Palpations: Abdomen is soft.  Genitourinary:    Rectum: Normal.     Comments: No Blood or Stool in Rectum External Hemorrhoids but not inflamed Musculoskeletal:        General: No swelling.     Cervical back: Neck supple.  Skin:    General: Skin is warm.  Neurological:     General: No focal deficit present.     Mental Status: She is alert and oriented to person, place, and time.  Psychiatric:        Mood and Affect: Mood normal.        Thought Content: Thought content normal.     Labs reviewed: Basic Metabolic Panel: Recent Labs    03/01/23 0810 08/30/23 0807  NA 142 138  K 4.3 4.4  CL 104 102  CO2 28 30  GLUCOSE 91 85  BUN 15 21  CREATININE 0.73 0.75  CALCIUM 9.4 9.3  TSH  --  1.84   Liver Function Tests: Recent Labs    03/01/23 0810 08/30/23 0807  AST 17 19  ALT 17 17   BILITOT 0.6 0.8  PROT 6.6 6.7   No results for input(s): LIPASE, AMYLASE in the last 8760 hours. No results for input(s): AMMONIA in the last 8760 hours. CBC: Recent Labs    03/01/23 0810 08/30/23 0807  WBC 6.2 7.2  NEUTROABS 4,166 4,889  HGB 14.9 15.5  HCT 45.2* 47.6*  MCV 91.1 91.7  PLT 248 221   Lipid Panel: Recent Labs    03/01/23 0810 08/30/23 0807  CHOL 168 155  HDL  59 60  LDLCALC 89 79  TRIG 104 80  CHOLHDL 2.8 2.6   No results found for: HGBA1C  Procedures since last visit: No results found.  Assessment/Plan 1. Rectal bleeding (Primary) rectal bleeding with bright red blood, likely due to hemorrhoids. No further episodes. Possible internal hemorrhoids. Consider diverticulopathy if bleeding persists or is profuse. - Prescribed anusol  HC suppositories for recurrent bleeding. - Advised nightly stool softener to prevent constipation. - Instructed to seek emergency care if bleeding is profuse or persistent.         Elevated hematocrit Repeat CBC this month   Labs/tests ordered:  * No order type specified * Next appt:  03/05/2024

## 2023-11-14 NOTE — Patient Instructions (Addendum)
 Use Stool Softener at night Use Suppository if you notice any bleeding Go to ED if bleeding does not stop July 28 th your labs will done at [facility] at 7:45 am

## 2023-12-03 ENCOUNTER — Ambulatory Visit: Payer: Self-pay | Admitting: Internal Medicine

## 2023-12-03 DIAGNOSIS — R718 Other abnormality of red blood cells: Secondary | ICD-10-CM | POA: Diagnosis not present

## 2023-12-03 LAB — CBC WITH DIFFERENTIAL/PLATELET
Absolute Lymphocytes: 1599 {cells}/uL (ref 850–3900)
Absolute Monocytes: 728 {cells}/uL (ref 200–950)
Basophils Absolute: 33 {cells}/uL (ref 0–200)
Basophils Relative: 0.5 %
Eosinophils Absolute: 78 {cells}/uL (ref 15–500)
Eosinophils Relative: 1.2 %
HCT: 46 % — ABNORMAL HIGH (ref 35.0–45.0)
Hemoglobin: 14.9 g/dL (ref 11.7–15.5)
MCH: 29.9 pg (ref 27.0–33.0)
MCHC: 32.4 g/dL (ref 32.0–36.0)
MCV: 92.4 fL (ref 80.0–100.0)
MPV: 10.6 fL (ref 7.5–12.5)
Monocytes Relative: 11.2 %
Neutro Abs: 4063 {cells}/uL (ref 1500–7800)
Neutrophils Relative %: 62.5 %
Platelets: 237 Thousand/uL (ref 140–400)
RBC: 4.98 Million/uL (ref 3.80–5.10)
RDW: 13.5 % (ref 11.0–15.0)
Total Lymphocyte: 24.6 %
WBC: 6.5 Thousand/uL (ref 3.8–10.8)

## 2023-12-29 ENCOUNTER — Encounter: Payer: Self-pay | Admitting: Internal Medicine

## 2024-01-23 ENCOUNTER — Other Ambulatory Visit: Payer: Self-pay

## 2024-01-23 DIAGNOSIS — E785 Hyperlipidemia, unspecified: Secondary | ICD-10-CM

## 2024-01-23 MED ORDER — SIMVASTATIN 20 MG PO TABS: 20.0000 mg | ORAL_TABLET | Freq: Every day | ORAL | 3 refills | Status: AC

## 2024-02-02 ENCOUNTER — Encounter: Payer: Self-pay | Admitting: Internal Medicine

## 2024-03-05 ENCOUNTER — Non-Acute Institutional Stay: Admitting: Internal Medicine

## 2024-03-05 ENCOUNTER — Encounter: Payer: Self-pay | Admitting: Internal Medicine

## 2024-03-05 VITALS — BP 116/62 | HR 79 | Temp 96.4°F | Resp 18 | Ht 61.0 in | Wt 136.6 lb

## 2024-03-05 DIAGNOSIS — E785 Hyperlipidemia, unspecified: Secondary | ICD-10-CM

## 2024-03-05 DIAGNOSIS — R718 Other abnormality of red blood cells: Secondary | ICD-10-CM

## 2024-03-05 NOTE — Progress Notes (Signed)
 Location:  [facility] of Service:  Clinic ([id number])  Provider:   Code Status: Full Code  Goals of Care:     03/05/2024   10:38 AM  Advanced Directives  Does Patient Have a Medical Advance Directive? No  Would patient like information on creating a medical advance directive? No - Patient declined     Chief Complaint  Patient presents with   Medical Management of Chronic Issues     Six Months Follow-up with labs, needs to make an appointment for medicare wellness visit and needs to discuss covid vaccine.    HPI: Patient is a 89 y.o. female seen today for medical management of chronic diseases.   Lives in [facility] [place]    h/o HLD, and right shoulder arthroplasty Elevated HCT And h/o Rectal Bleeding resolved with Anusol  .  Patient has a history of episode of syncope in 02/24 Her workup was negative and it was thought to be due to dehydration Discussed the use of AI scribe software for clinical note transcription with the patient, who gave verbal consent to proceed.  History of Present Illness   Sherry Padilla is an 89 year old female who presents for a follow-up visit.  She has undergone joint shoulder replacement with a titanium ball and experiences difficulty using a cane in her right hand while walking. She prefers to walk fast to avoid instability and uses railings for support when necessary. She engages in water exercises three times a week for an hour and a half, including walking backwards to utilize different muscles, and participates in chair yoga three times a week. She no longer performs the backstroke in water due to sinus drainage issues from chlorine exposure. Her weight fluctuates between 136 and 140 pounds, and she is mindful of her diet to avoid weight gain. She takes vitamin D and calcium supplements but has discontinued iron and magnesium due to gastrointestinal upset. She reports no knee problems, no recent episodes of bleeding or  dizziness, regular bowel movements with occasional use of a stool softener, and good sleep.      No past medical history on file.    Past Surgical History:  Procedure Laterality Date   BREAST BIOPSY Right 2014   Right Shoulder Arthroplasty      Allergies  Allergen Reactions   Chlorine Other (See Comments)    Sinus drainage    Outpatient Encounter Medications as of 03/05/2024  Medication Sig   Ascorbic Acid (VITAMIN C PO) Take 1 tablet by mouth daily.   B Complex-C-Folic Acid (RENAL) 1 MG CAPS Take 1 capsule by mouth.   CALCIUM PO Take by mouth daily.   Cholecalciferol 100 MCG (4000 UT) CAPS Take 1 capsule by mouth.   GARLIC PO Take by mouth.   Ginger, Zingiber officinalis, (GINGER EXTRACT PO) by miscellaneous route.   hydrocortisone  (ANUSOL -HC) 25 MG suppository Place 1 suppository (25 mg total) rectally at bedtime as needed for hemorrhoids or anal itching.   Multiple Vitamin (MULTIVITAMIN) capsule Take 1 capsule by mouth daily.   Multiple Vitamins-Minerals (PRESERVISION AREDS 2) CAPS Take by mouth.   Multiple Vitamins-Minerals (ZINC PO) Take 1 tablet by mouth daily.   Omega-3 1000 MG CAPS Take 1 capsule by mouth daily.   Probiotic, Lactobacillus, CAPS Take by mouth.   simvastatin  (ZOCOR ) 20 MG tablet Take 1 tablet (20 mg total) by mouth at bedtime.   TURMERIC CURCUMIN PO by Misc.(Non-Drug; Combo Route) route.   VITAMIN E PO Take  1 tablet by mouth daily.   No facility-administered encounter medications on file as of 03/05/2024.    Review of Systems:  Review of Systems  Constitutional:  Negative for activity change and appetite change.  HENT: Negative.    Respiratory:  Negative for cough and shortness of breath.   Cardiovascular:  Negative for leg swelling.  Gastrointestinal:  Negative for constipation.  Genitourinary: Negative.   Musculoskeletal:  Negative for arthralgias, gait problem and myalgias.  Skin: Negative.   Neurological:  Negative for dizziness and weakness.   Psychiatric/Behavioral:  Negative for confusion, dysphoric mood and sleep disturbance.     Health Maintenance  Topic Date Due   Medicare Annual Wellness (AWV)  10/16/2023   COVID-19 Vaccine (7 - 2025-26 season) 01/07/2024   Pneumococcal Vaccine: 50+ Years  Completed   Influenza Vaccine  Completed   Zoster Vaccines- Shingrix  Completed   Meningococcal B Vaccine  Aged Out   DTaP/Tdap/Td  Discontinued   DEXA SCAN  Discontinued    Physical Exam: Vitals:   03/05/24 1036  BP: 116/62  Pulse: 79  Resp: 18  Temp: (!) 96.4 F (35.8 C)  SpO2: 97%  Weight: 136 lb 9.6 oz (62 kg)  Height: 5' 1 (1.549 m)   Body mass index is 25.81 kg/m. Physical Exam Vitals reviewed.  Constitutional:      Appearance: Normal appearance.  HENT:     Head: Normocephalic.     Nose: Nose normal.     Mouth/Throat:     Mouth: Mucous membranes are moist.     Pharynx: Oropharynx is clear.  Eyes:     Pupils: Pupils are equal, round, and reactive to light.  Cardiovascular:     Rate and Rhythm: Normal rate and regular rhythm.     Pulses: Normal pulses.     Heart sounds: Normal heart sounds. No murmur heard. Pulmonary:     Effort: Pulmonary effort is normal.     Breath sounds: Normal breath sounds.  Abdominal:     General: Abdomen is flat. Bowel sounds are normal.     Palpations: Abdomen is soft.  Musculoskeletal:        General: No swelling.     Cervical back: Neck supple.  Skin:    General: Skin is warm.  Neurological:     General: No focal deficit present.     Mental Status: She is alert and oriented to person, place, and time.  Psychiatric:        Mood and Affect: Mood normal.        Thought Content: Thought content normal.     Labs reviewed: Basic Metabolic Panel: Recent Labs    08/30/23 0807  NA 138  K 4.4  CL 102  CO2 30  GLUCOSE 85  BUN 21  CREATININE 0.75  CALCIUM 9.3  TSH 1.84   Liver Function Tests: Recent Labs    08/30/23 0807  AST 19  ALT 17  BILITOT 0.8  PROT  6.7   No results for input(s): LIPASE, AMYLASE in the last 8760 hours. No results for input(s): AMMONIA in the last 8760 hours. CBC: Recent Labs    08/30/23 0807 12/03/23 0830  WBC 7.2 6.5  NEUTROABS 4,889 4,063  HGB 15.5 14.9  HCT 47.6* 46.0*  MCV 91.7 92.4  PLT 221 237   Lipid Panel: Recent Labs    08/30/23 0807  CHOL 155  HDL 60  LDLCALC 79  TRIG 80  CHOLHDL 2.6   No results found for:  HGBA1C  Procedures since last visit: No results found.  Assessment/Plan   Hyperlipidemia, unspecified hyperlipidemia type (Primary) On statin Family history not available as she was adopted   - TSH - Lipid panel - COMPLETE METABOLIC PANEL WITHOUT GFR  2. Elevated hematocrit  - CBC with Differential/Platelet     H/o Status post shoulder joint replacement Underwent left shoulder joint replacement with titanium ball due to cartilage wear. Prefers fast walking for balance, uses right hand for cane. - Continue water exercises, avoid backstroke.  .  Constipation Regular bowel movements with occasional stool softener use. Discontinued iron and magnesium improved symptoms. - Continue occasional stool softener as needed.  Weight management Weight stable between 136-140 lbs. Mindful diet and regular physical activity including water exercises and chair yoga.  General Health Maintenance Takes vitamin D, plans for COVID-19 vaccination, declined bone density test and mammograms, attends regular eye appointments. - Administer COVID-19 vaccination with Modern[facility] at [facility]. - Schedule blood work after Thanksgiving. - Schedule wellness visit in January.        Labs/tests ordered:  * No order type specified * Next appt:  Visit date not found

## 2024-04-07 DIAGNOSIS — E785 Hyperlipidemia, unspecified: Secondary | ICD-10-CM | POA: Diagnosis not present

## 2024-04-07 DIAGNOSIS — R718 Other abnormality of red blood cells: Secondary | ICD-10-CM | POA: Diagnosis not present

## 2024-04-07 LAB — COMPLETE METABOLIC PANEL WITHOUT GFR
AG Ratio: 1.9 (calc) (ref 1.0–2.5)
ALT: 21 U/L (ref 6–29)
AST: 20 U/L (ref 10–35)
Albumin: 4.4 g/dL (ref 3.6–5.1)
Alkaline phosphatase (APISO): 98 U/L (ref 37–153)
BUN: 12 mg/dL (ref 7–25)
CO2: 28 mmol/L (ref 20–32)
Calcium: 9.7 mg/dL (ref 8.6–10.4)
Chloride: 100 mmol/L (ref 98–110)
Creat: 0.64 mg/dL (ref 0.60–0.95)
Globulin: 2.3 g/dL (ref 1.9–3.7)
Glucose, Bld: 94 mg/dL (ref 65–99)
Potassium: 4.6 mmol/L (ref 3.5–5.3)
Sodium: 138 mmol/L (ref 135–146)
Total Bilirubin: 0.7 mg/dL (ref 0.2–1.2)
Total Protein: 6.7 g/dL (ref 6.1–8.1)

## 2024-04-07 LAB — CBC WITH DIFFERENTIAL/PLATELET
Absolute Lymphocytes: 1491 {cells}/uL (ref 850–3900)
Absolute Monocytes: 826 {cells}/uL (ref 200–950)
Basophils Absolute: 42 {cells}/uL (ref 0–200)
Basophils Relative: 0.6 %
Eosinophils Absolute: 112 {cells}/uL (ref 15–500)
Eosinophils Relative: 1.6 %
HCT: 46.7 % — ABNORMAL HIGH (ref 35.9–46.0)
Hemoglobin: 15.4 g/dL (ref 11.7–15.5)
MCH: 29.7 pg (ref 27.0–33.0)
MCHC: 33 g/dL (ref 31.6–35.4)
MCV: 90.2 fL (ref 81.4–101.7)
MPV: 10.6 fL (ref 7.5–12.5)
Monocytes Relative: 11.8 %
Neutro Abs: 4529 {cells}/uL (ref 1500–7800)
Neutrophils Relative %: 64.7 %
Platelets: 261 Thousand/uL (ref 140–400)
RBC: 5.18 Million/uL — ABNORMAL HIGH (ref 3.80–5.10)
RDW: 13.1 % (ref 11.0–15.0)
Total Lymphocyte: 21.3 %
WBC: 7 Thousand/uL (ref 3.8–10.8)

## 2024-04-07 LAB — LIPID PANEL
Cholesterol: 142 mg/dL (ref <200)
HDL: 55 mg/dL (ref >=50)
LDL Cholesterol (Calc): 68 mg/dL
Non-HDL Cholesterol (Calc): 87 mg/dL (ref <130)
Total CHOL/HDL Ratio: 2.6 (calc) (ref <5.0)
Triglycerides: 108 mg/dL (ref <150)

## 2024-04-07 LAB — TSH: TSH: 2.48 m[IU]/L (ref 0.40–4.50)

## 2024-04-11 ENCOUNTER — Ambulatory Visit: Payer: Self-pay | Admitting: Internal Medicine

## 2024-05-15 ENCOUNTER — Encounter: Payer: Self-pay | Admitting: Nurse Practitioner

## 2024-05-15 ENCOUNTER — Ambulatory Visit: Admitting: Nurse Practitioner

## 2024-05-15 VITALS — BP 124/76 | HR 93 | Temp 97.6°F | Resp 20 | Ht 61.0 in | Wt 137.2 lb

## 2024-05-15 DIAGNOSIS — Z0000 Encounter for general adult medical examination without abnormal findings: Secondary | ICD-10-CM | POA: Diagnosis not present

## 2024-05-15 NOTE — Progress Notes (Signed)
 "  Chief Complaint  Patient presents with   Medicare Wellness    Annual Wellness Visit.     Subjective:   Sherry Padilla is a 89 y.o. female who presents for a Medicare Annual Wellness Visit in the Clinic [facility]  Visit info / Clinical Intake: Medicare Wellness Visit Type:: Initial Annual Wellness Visit Persons participating in visit and providing information:: patient (Granddaughter present) Medicare Wellness Visit Mode:: In-person (required for WTM) Interpreter Needed?: No Pre-visit prep was completed: yes AWV questionnaire completed by patient prior to visit?: no Living arrangements:: (!) lives alone Patient's Overall Health Status Rating: good Typical amount of pain: none Does pain affect daily life?: no Are you currently prescribed opioids?: no  Dietary Habits and Nutritional Risks How many meals a day?: 3 Eats fruit and vegetables daily?: yes Most meals are obtained by: preparing own meals; eating out In the last 2 weeks, have you had any of the following?: none Diabetic:: no  Functional Status Activities of Daily Living (to include ambulation/medication): Independent Ambulation: Independent Medication Administration: Independent Home Management (perform basic housework or laundry): Independent Manage your own finances?: (!) no (POA) Primary transportation is: facility / other  Fall Screening Falls in the past year?: 0 Number of falls in past year: 0 Was there an injury with Fall?: 0 Fall Risk Category Calculator: 0 Patient Fall Risk Level: Low Fall Risk  Fall Risk Patient at Risk for Falls Due to: No Fall Risks Fall risk Follow up: Falls evaluation completed  Cognitive Assessment Difficulty concentrating, remembering, or making decisions? : yes Will 6CIT or Mini Cog be Completed: yes What year is it?: 0 points What month is it?: 0 points Give patient an address phrase to remember (5 components): [address] [place]  About what time is  it?: 0 points Count backwards from 20 to 1: 0 points Say the months of the year in reverse: 0 points Repeat the address phrase from earlier: 2 points 6 CIT Score: 2 points  Advance Directives (For Healthcare) Does Patient Have a Medical Advance Directive?: Yes Does patient want to make changes to medical advance directive?: No - Patient declined Type of Advance Directive: Healthcare Power of [place]; Living will Copy of Healthcare Power of Attorney in Chart?: No - copy requested Copy of Living Will in Chart?: No - copy requested Would patient like information on creating a medical advance directive?: No - Patient declined  Reviewed/Updated  Reviewed/Updated: Reviewed All (Medical, Surgical, Family, Medications, Allergies, Care Teams, Patient Goals)    Allergies (verified) Chlorine   Current Medications (verified) Outpatient Encounter Medications as of 05/15/2024  Medication Sig   Ascorbic Acid (VITAMIN C PO) Take 1 tablet by mouth daily.   B Complex-C-Folic Acid (RENAL) 1 MG CAPS Take 1 capsule by mouth.   CALCIUM PO Take by mouth daily.   Cholecalciferol 100 MCG (4000 UT) CAPS Take 1 capsule by mouth.   GARLIC PO Take by mouth.   Ginger, Zingiber officinalis, (GINGER EXTRACT PO) by miscellaneous route.   hydrocortisone  (ANUSOL -HC) 25 MG suppository Place 1 suppository (25 mg total) rectally at bedtime as needed for hemorrhoids or anal itching.   Multiple Vitamin (MULTIVITAMIN) capsule Take 1 capsule by mouth daily.   Multiple Vitamins-Minerals (PRESERVISION AREDS 2) CAPS Take by mouth.   Multiple Vitamins-Minerals (ZINC PO) Take 1 tablet by mouth daily.   Omega-3 1000 MG CAPS Take 1 capsule by mouth daily.   Probiotic, Lactobacillus, CAPS Take by mouth.   simvastatin  (ZOCOR ) 20 MG tablet  Take 1 tablet (20 mg total) by mouth at bedtime.   TURMERIC CURCUMIN PO by Misc.(Non-Drug; Combo Route) route.   VITAMIN E PO Take 1 tablet by mouth daily.   No facility-administered  encounter medications on file as of 05/15/2024.    History: History reviewed. No pertinent past medical history. Past Surgical History:  Procedure Laterality Date   BREAST BIOPSY Right 2014   Right Shoulder Arthroplasty     History reviewed. No pertinent family history. Social History   Occupational History   Not on file  Tobacco Use   Smoking status: Never   Smokeless tobacco: Never  Substance and Sexual Activity   Alcohol use: Not Currently   Drug use: Never   Sexual activity: Not Currently   Tobacco Counseling Counseling given: Not Answered  SDOH Screenings   Food Insecurity: No Food Insecurity (10/16/2022)  Housing: Unknown (05/15/2024)  Transportation Needs: Unmet Transportation Needs (05/15/2024)  Utilities: Not At Risk (10/16/2022)  Alcohol Screen: Low Risk (10/16/2022)  Depression (PHQ2-9): Low Risk (05/15/2024)  Financial Resource Strain: Low Risk (10/16/2022)  Physical Activity: Sufficiently Active (10/16/2022)  Social Connections: Moderately Isolated (10/16/2022)  Stress: No Stress Concern Present (10/16/2022)  Tobacco Use: Low Risk (05/16/2024)   See flowsheets for full screening details  Depression Screen PHQ 2 & 9 Depression Scale- Over the past 2 weeks, how often have you been bothered by any of the following problems? Little interest or pleasure in doing things: 0 Feeling down, depressed, or hopeless (PHQ Adolescent also includes...irritable): 0 PHQ-2 Total Score: 0     Goals Addressed   None          Objective:    Today's Vitals   05/15/24 1418  BP: 124/76  Pulse: 93  Resp: 20  Temp: 97.6 F (36.4 C)  SpO2: 97%  Weight: 137 lb 3.2 oz (62.2 kg)  Height: 5' 1 (1.549 m)   Body mass index is 25.92 kg/m.  Hearing/Vision screen Hearing Screening - Comments:: No hearing issues Vision Screening - Comments:: Eye exam March 2025 no issues [facility] in [place]. Immunizations and Health Maintenance Health Maintenance  Topic Date Due   COVID-19  Vaccine (8 - 2025-26 season) 09/08/2024   Medicare Annual Wellness (AWV)  05/15/2025   Pneumococcal Vaccine: 50+ Years  Completed   Influenza Vaccine  Completed   Zoster Vaccines- Shingrix  Completed   Meningococcal B Vaccine  Aged Out   DTaP/Tdap/Td  Discontinued   Bone Density Scan  Discontinued        Assessment/Plan:  This is a routine wellness examination for [place].  Patient Care Team: Charlanne Fredia CROME, MD as PCP - General (Internal Medicine)  I have personally reviewed and noted the following in the patients chart:   Medical and social history Use of alcohol, tobacco or illicit drugs  Current medications and supplements including opioid prescriptions. Functional ability and status Nutritional status Physical activity Advanced directives List of other physicians Hospitalizations, surgeries, and ER visits in previous 12 months Vitals Screenings to include cognitive, depression, and falls Referrals and appointments  No orders of the defined types were placed in this encounter.  In addition, I have reviewed and discussed with patient certain preventive protocols, quality metrics, and best practice recommendations. A written personalized care plan for preventive services as well as general preventive health recommendations were provided to patient.   Henritta Mutz X Justus Droke, NP   05/16/2024   Return in 1 year (on 05/15/2025).  After Visit Summary: (In Person-Printed) AVS printed  and given to the patient   "

## 2024-05-16 ENCOUNTER — Encounter: Payer: Self-pay | Admitting: Nurse Practitioner

## 2024-05-23 ENCOUNTER — Emergency Department (HOSPITAL_COMMUNITY)
Admission: EM | Admit: 2024-05-23 | Discharge: 2024-05-23 | Disposition: A | Discharge status: Home / Self Care | Attending: Emergency Medicine | Admitting: Emergency Medicine

## 2024-05-23 ENCOUNTER — Emergency Department (HOSPITAL_COMMUNITY)

## 2024-05-23 ENCOUNTER — Encounter (HOSPITAL_COMMUNITY): Payer: Self-pay

## 2024-05-23 ENCOUNTER — Other Ambulatory Visit: Payer: Self-pay

## 2024-05-23 DIAGNOSIS — R55 Syncope and collapse: Secondary | ICD-10-CM | POA: Insufficient documentation

## 2024-05-23 LAB — CBC WITH DIFFERENTIAL/PLATELET
Abs Immature Granulocytes: 0.05 K/uL (ref 0.00–0.07)
Basophils Absolute: 0 K/uL (ref 0.0–0.1)
Basophils Relative: 0 %
Eosinophils Absolute: 0 K/uL (ref 0.0–0.5)
Eosinophils Relative: 0 %
HCT: 45.5 % (ref 36.0–46.0)
Hemoglobin: 14.9 g/dL (ref 12.0–15.0)
Immature Granulocytes: 1 %
Lymphocytes Relative: 9 %
Lymphs Abs: 0.9 K/uL (ref 0.7–4.0)
MCH: 30 pg (ref 26.0–34.0)
MCHC: 32.7 g/dL (ref 30.0–36.0)
MCV: 91.5 fL (ref 80.0–100.0)
Monocytes Absolute: 0.7 K/uL (ref 0.1–1.0)
Monocytes Relative: 7 %
Neutro Abs: 8 K/uL — ABNORMAL HIGH (ref 1.7–7.7)
Neutrophils Relative %: 83 %
Platelets: 261 K/uL (ref 150–400)
RBC: 4.97 MIL/uL (ref 3.87–5.11)
RDW: 13.8 % (ref 11.5–15.5)
WBC: 9.7 K/uL (ref 4.0–10.5)
nRBC: 0 % (ref 0.0–0.2)

## 2024-05-23 LAB — COMPREHENSIVE METABOLIC PANEL WITH GFR
ALT: 25 U/L (ref 0–44)
AST: 27 U/L (ref 15–41)
Albumin: 4.1 g/dL (ref 3.5–5.0)
Alkaline Phosphatase: 114 U/L (ref 38–126)
Anion gap: 10 (ref 5–15)
BUN: 21 mg/dL (ref 8–23)
CO2: 27 mmol/L (ref 22–32)
Calcium: 9.8 mg/dL (ref 8.9–10.3)
Chloride: 102 mmol/L (ref 98–111)
Creatinine, Ser: 0.69 mg/dL (ref 0.44–1.00)
GFR, Estimated: >60 mL/min
Glucose, Bld: 113 mg/dL — ABNORMAL HIGH (ref 70–99)
Potassium: 4 mmol/L (ref 3.5–5.1)
Sodium: 139 mmol/L (ref 135–145)
Total Bilirubin: 0.4 mg/dL (ref 0.0–1.2)
Total Protein: 7.1 g/dL (ref 6.5–8.1)

## 2024-05-23 LAB — URINALYSIS, ROUTINE W REFLEX MICROSCOPIC
Bilirubin Urine: NEGATIVE
Glucose, UA: NEGATIVE mg/dL
Hgb urine dipstick: NEGATIVE
Ketones, ur: NEGATIVE mg/dL
Leukocytes,Ua: NEGATIVE
Nitrite: NEGATIVE
Protein, ur: NEGATIVE mg/dL
Specific Gravity, Urine: 1.008 (ref 1.005–1.030)
pH: 6 (ref 5.0–8.0)

## 2024-05-23 LAB — TROPONIN T, HIGH SENSITIVITY
Troponin T High Sensitivity: 15 ng/L (ref 0–19)
Troponin T High Sensitivity: <15 ng/L (ref 0–19)

## 2024-05-23 LAB — CBG MONITORING, ED: Glucose-Capillary: 125 mg/dL — ABNORMAL HIGH (ref 70–99)

## 2024-05-23 NOTE — Discharge Instructions (Addendum)
 It is unclear why you experienced your loss of consciousness episode today, however I have provided you with a referral to a cardiologist in order to undergo further evaluation since you have had several episodes of this over the past 2 years. The cardiology office should be in contact with you in order to schedule a visit to establish care. Follow-up with your primary care provider as scheduled.  Return to the emergency department if your symptoms return or worsen.

## 2024-05-23 NOTE — ED Provider Notes (Signed)
 " [place] EMERGENCY DEPARTMENT AT [facility] Provider Note   CSN: [id number] Arrival date & time: 05/23/24  1153     Patient presents with: Loss of Consciousness   Sherry Padilla is a 89 y.o. female.   89 year old female presenting after a syncopal event x 2.  Patient lives at [facility], attended water aerobics and chair yoga today, reports that while she was in chair yoga she was told that she passed out twice according to other participants of the class.  She was in her chair when these incidences occurred and did not fall out of the chair, did not strike her head.  Patient notes that when she did lose consciousness, she felt hot/lightheaded prior to this happening as well as a sharp pain in her chest that has since resolved / not returned.  She does not have a pacemaker or other implanted cardiac device.  She reports that she did eat breakfast this morning, has not experienced any abdominal pain/nausea/vomiting/diarrhea, has not experienced any shortness of breath, no headaches or dizziness.  Patient had a similar syncopal event in 2024 that resulted in a fall/head injury.   Loss of Consciousness      Prior to Admission medications  Medication Sig Start Date End Date Taking? Authorizing Provider  Ascorbic Acid (VITAMIN C PO) Take 1 tablet by mouth daily.    [provider]  B Complex-C-Folic Acid (RENAL) 1 MG CAPS Take 1 capsule by mouth.    [provider]  CALCIUM PO Take by mouth daily.    [provider]  Cholecalciferol 100 MCG (4000 UT) CAPS Take 1 capsule by mouth.    [provider]  GARLIC PO Take by mouth. 07/16/19   [provider]  Ginger, Zingiber officinalis, (GINGER EXTRACT PO) by miscellaneous route. 12/21/15   [provider]  hydrocortisone  (ANUSOL -HC) 25 MG suppository Place 1 suppository (25 mg total) rectally at bedtime as needed for hemorrhoids or anal itching. 11/14/23    Gupta, Anjali L, MD  Multiple Vitamin (MULTIVITAMIN) capsule Take 1 capsule by mouth daily.    [provider]  Multiple Vitamins-Minerals (PRESERVISION AREDS 2) CAPS Take by mouth.    [provider]  Multiple Vitamins-Minerals (ZINC PO) Take 1 tablet by mouth daily.    [provider]  Omega-3 1000 MG CAPS Take 1 capsule by mouth daily.    [provider]  Probiotic, Lactobacillus, CAPS Take by mouth. 12/21/15   [provider]  simvastatin  (ZOCOR ) 20 MG tablet Take 1 tablet (20 mg total) by mouth at bedtime. 01/23/24   Charlanne Fredia CROME, MD  TURMERIC CURCUMIN PO by Misc.(Non-Drug; Combo Route) route.    [provider]  VITAMIN E PO Take 1 tablet by mouth daily.    [provider]    Allergies: Chlorine    Review of Systems  Cardiovascular:  Positive for syncope.    Updated Vital Signs  Vitals:   05/23/24 1210  BP: (!) 139/58  Pulse: 84  Resp: 18  Temp: 97.7 F (36.5 C)  TempSrc: Oral  SpO2: 98%     Physical Exam Vitals and nursing note reviewed.  Constitutional:      General: She is not in acute distress.    Appearance: Normal appearance. She is not ill-appearing or toxic-appearing.  HENT:     Head: Normocephalic and atraumatic.     Mouth/Throat:     Mouth: Mucous membranes are moist.  Eyes:  Extraocular Movements: Extraocular movements intact.     Pupils: Pupils are equal, round, and reactive to light.  Cardiovascular:     Rate and Rhythm: Normal rate and regular rhythm.     Pulses:          Radial pulses are 2+ on the right side and 2+ on the left side.       Dorsalis pedis pulses are 2+ on the right side and 2+ on the left side.     Heart sounds: Normal heart sounds.  Pulmonary:     Effort: Pulmonary effort is normal. No respiratory distress.     Breath sounds: Normal breath sounds.  Abdominal:     Palpations: Abdomen is soft.     Tenderness: There is no abdominal tenderness. There is no  guarding.  Musculoskeletal:     Cervical back: Normal range of motion and neck supple.     Right lower leg: No edema.     Left lower leg: No edema.     Comments: Moves all extremities spontaneously without difficulty 5/5 strength against resistance of bilateral upper and lower extremities   Skin:    General: Skin is warm and dry.  Neurological:     General: No focal deficit present.     Mental Status: She is alert and oriented to person, place, and time.     Comments: Facial expressions are symmetric and intact without evidence of facial droop Normal cerebellar testing without ataxia, including finger-to-nose Negative pronator drift No appreciable sensory deficits or weaknesses     (all labs ordered are listed, but only abnormal results are displayed) Labs Reviewed  COMPREHENSIVE METABOLIC PANEL WITH GFR - Abnormal; Notable for the following components:      Result Value   Glucose, Bld 113 (*)    All other components within normal limits  CBC WITH DIFFERENTIAL/PLATELET - Abnormal; Notable for the following components:   Neutro Abs 8.0 (*)    All other components within normal limits  URINALYSIS, ROUTINE W REFLEX MICROSCOPIC - Abnormal; Notable for the following components:   APPearance HAZY (*)    All other components within normal limits  CBG MONITORING, ED - Abnormal; Notable for the following components:   Glucose-Capillary 125 (*)    All other components within normal limits  TROPONIN T, HIGH SENSITIVITY  TROPONIN T, HIGH SENSITIVITY    EKG: EKG Interpretation Date/Time:  Friday May 23 2024 13:02:48 EST Ventricular Rate:  79 PR Interval:  165 QRS Duration:  94 QT Interval:  411 QTC Calculation: 472 R Axis:   45  Text Interpretation: Sinus rhythm Probable left atrial enlargement Low voltage, precordial leads RSR' in V1 or V2, right VCD or RVH No significant change since last tracing Confirmed by Ellouise Fine ([id number]) on 05/23/2024 1:50:07 PM  Radiology: [facility]  Abd 1 View Result Date: 05/23/2024 CLINICAL DATA:  Syncope.  Evaluate for free air. EXAM: ABDOMEN - 1 VIEW COMPARISON:  Chest radiograph 05/23/2024 FINDINGS: Left lateral decubitus images were obtained of the abdomen. There is no evidence for free air. Curvature in the lumbar spine. Gas and stool in the colon. IMPRESSION: No evidence for free air. Electronically Signed   By: Juliene Balder M.D.   On: 05/23/2024 15:44   DG Chest Portable 1 View Result Date: 05/23/2024 EXAM: 1 VIEW(S) XRAY OF THE CHEST 05/23/2024 12:48:00 PM COMPARISON: None available. CLINICAL HISTORY: chest pain chest pain chest pain chest pain chest pain FINDINGS: LUNGS AND PLEURA: Elevated left hemidiaphragm. Hazy opacity at  left lung base, possibly representing atelectasis or pneumonia. Questionable lucency beneath the left hemidiaphragm versus bandlike atelectasis at the left lung base - recommend left side down lateral decubitus view of the abdomen to exclude free intraperitoneal gas. Mild subsegmental atelectasis along the right hemidiaphragm. No pleural effusion. No pneumothorax. HEART AND MEDIASTINUM: Aortic atherosclerotic vascular calcification. No acute abnormality of the cardiac and mediastinal silhouettes. BONES AND SOFT TISSUES: Right shoulder arthroplasty hardware in place. Thoracic spondylosis. No acute osseous abnormality. IMPRESSION: 1. Lucency beneath the left hemidiaphragm versus bandlike left basilar atelectasis; left-side-down lateral decubitus abdominal radiograph (or alternatively if there is a high clinical index of suspicion, CT scan of the abdomen/pelvis) recommended to exclude free intraperitoneal gas. 2. Hazy left basilar opacity, atelectasis versus pneumonia. 3. Mild subsegmental atelectasis along the right hemidiaphragm. 4. Elevated left hemidiaphragm. 5. Aortic atherosclerotic calcification. Electronically signed by: Ryan Salvage MD 05/23/2024 01:15 PM EST RP Workstation: [id number]     Procedures    Medications Ordered in the ED - No data to display                                  Medical Decision Making This patient presents to the ED for concern of syncope, this involves an extensive number of treatment options, and is a complaint that carries with it a high risk of complications and morbidity.  The differential diagnosis includes ACS, AAA, dysrhythmia/arrhythmia, hypoglycemia, orthostatic hypotension   Co morbidities that complicate the patient evaluation  Hyperlipidemia   Additional history obtained:  Additional history obtained from record review External records from outside source obtained and reviewed including prior ED note February 2024   Lab Tests:  I Ordered, and personally interpreted labs.  The pertinent results include: CBC largely unremarkable, no leukocytosis.  CMP unremarkable.  Initial troponin 15, repeat remains unchanged.  CBG normal, 125.  Urinalysis unremarkable.   Imaging Studies ordered:  I ordered imaging studies including CXR  I independently visualized and interpreted imaging which showed  CXR: 1. Lucency beneath the left hemidiaphragm versus bandlike left basilar atelectasis; left-side-down lateral decubitus abdominal radiograph (or alternatively if there is a high clinical index of suspicion, CT scan of the abdomen/pelvis) recommended to exclude free intraperitoneal gas. 2. Hazy left basilar opacity, atelectasis versus pneumonia. 3. Mild subsegmental atelectasis along the right hemidiaphragm. 4. Elevated left hemidiaphragm. 5. Aortic atherosclerotic calcification. XR abdomen left side down/lateral decubitus: No evidence for free air.  I agree with the radiologist interpretation   Cardiac Monitoring: / EKG:  The patient was maintained on a cardiac monitor.  I personally viewed and interpreted the cardiac monitored which showed an underlying rhythm of: NSR   Problem List / ED Course / Critical interventions / Medication management I have  reviewed the patients home medicines and have made adjustments as needed   Social Determinants of Health:  Unmet transportation needs   Test / Admission - Considered:  Physical exam is largely unremarkable as above, patient does not demonstrate any focal neurologic deficits on exam, she is well-appearing and in no acute distress, clinically she does not appear dehydrated.  Patient had 2 syncopal events today that were witnessed by members of her chair yoga class, she did not fall or strike her head when this occurred but remained in the chair and seem to lose consciousness while in a seated position.  Patient has had similar episode in the past which she chalked up to dehydration at  that time, she was seen/evaluated in the emergency department at that time.  No history of any cardiac conditions or pacemaker/other implanted devices.  Orthostatics: Orthostatic Lying BP- Lying: 144/86Pulse- Lying: 85 Orthostatic Sitting BP- Sitting: 128/106 (!)Pulse- Sitting: 102 Orthostatic Standing at 0 minutes BP- Standing at 0 minutes: 140/106 (!)Pulse- Standing at 0 minutes: 100 Orthostatic Standing at 3 minutes BP- Standing at 3 minutes: 142/99 (!)Pulse- Standing at 3 minutes: 90  Patient did become mildly tachycardic while assessing orthostatic vitals, this resolved quickly.  Clinically, she does not appear dehydrated, she is tolerating p.o. without difficulty and has not had any nausea/vomiting/diarrhea.  She has not had any recurrence of her symptoms and reports that she is feeling at her baseline since presenting to the emergency department today. ACS workup is reassuring as above, normal troponin x 2, EKG without acute ischemic changes.  Patient did have some slight chest discomfort when this episode occurred earlier today, she has never been seen or evaluated by a cardiologist. I do feel that further evaluation with a cardiology referral is indicated given her recurrent episodes of syncope at this  point, patient and her family members are in agreement with this plan.  Strict return precautions discussed, she voiced understanding is in agreement, she is appropriate for discharge at this time. Staffed with Dr. Ellouise  Amount and/or Complexity of Data Reviewed Labs: ordered. Radiology: ordered.        Final diagnoses:  Syncope, unspecified syncope type    ED Discharge Orders          Ordered    Ambulatory referral to Cardiology       Comments: If you have not heard from the Cardiology office within the next 72 hours please call [phone].   05/23/24 1722               Glendia Rocky SAILOR, PA-C 05/23/24 1903    Ellouise Richerd POUR, DO 05/24/24 1456  "

## 2024-05-23 NOTE — ED Triage Notes (Signed)
 Pt BIB EMS from [facility] due to two syncopal episodes in chair witnessed by staff. Pt reports not knowing she passed out. Pt just finished 1.5 hour long water aerobics and chair yoga when she has a syncopal episode. No fall/ injuries. Hx of syncope in past. AAOx4  En Route CBG 154 Orthostatics negative BP 140/80

## 2024-05-27 ENCOUNTER — Encounter: Payer: Self-pay | Admitting: Physician Assistant

## 2024-05-27 ENCOUNTER — Ambulatory Visit: Attending: Physician Assistant | Admitting: Physician Assistant

## 2024-05-27 ENCOUNTER — Ambulatory Visit

## 2024-05-27 VITALS — BP 136/80 | HR 94 | Ht 61.0 in | Wt 137.0 lb

## 2024-05-27 DIAGNOSIS — R55 Syncope and collapse: Secondary | ICD-10-CM

## 2024-05-27 NOTE — Patient Instructions (Signed)
 Medication Instructions:  Your physician recommends that you continue on your current medications as directed. Please refer to the Current Medication list given to you today.  *If you need a refill on your cardiac medications before your next appointment, please call your pharmacy*  Lab Work: None ordered  If you have labs (blood work) drawn today and your tests are completely normal, you will receive your results only by: MyChart Message (if you have MyChart) OR A paper copy in the mail If you have any lab test that is abnormal or we need to change your treatment, we will call you to review the results.  Testing/Procedures: Your physician has requested that you have a carotid duplex. This test is an ultrasound of the carotid arteries in your neck. It looks at blood flow through these arteries that supply the brain with blood. Allow one hour for this exam. There are no restrictions or special instructions.  Your physician has requested that you have an echocardiogram. Echocardiography is a painless test that uses sound waves to create images of your heart. It provides your doctor with information about the size and shape of your heart and how well your hearts chambers and valves are working. This procedure takes approximately one hour. There are no restrictions for this procedure. Please do NOT wear cologne, perfume, aftershave, or lotions (deodorant is allowed). Please arrive 15 minutes prior to your appointment time.  Please note: We ask at that you not bring children with you during ultrasound (echo/ vascular) testing. Due to room size and safety concerns, children are not allowed in the ultrasound rooms during exams. Our front office staff cannot provide observation of children in our lobby area while testing is being conducted. An adult accompanying a patient to their appointment will only be allowed in the ultrasound room at the discretion of the ultrasound technician under special  circumstances. We apologize for any inconvenience.   ZIO AT Long term monitor-Live Telemetry  Your physician has requested you wear a ZIO patch monitor for 14 DAYS.  This is a single patch monitor. [employer] supplies one patch monitor per enrollment. Additional  stickers are not available.  Please do not apply patch if you will be having a Nuclear Stress Test, Echocardiogram, Cardiac CT, MRI,  or Chest Xray during the period you would be wearing the monitor. The patch cannot be worn during  these tests. You cannot remove and re-apply the ZIO AT patch monitor.  Your ZIO patch monitor will be mailed 3 day USPS to your address on file. It may take 3-5 days to  receive your monitor after you have been enrolled.  Once you have received your monitor, please review the enclosed instructions. Your monitor has  already been registered assigning a specific monitor serial # to you.   Billing and Patient Assistance Program information  Meredeth has been supplied with any insurance information on record for billing. [employer] offers a sliding scale Patient Assistance Program for patients without insurance, or whose  insurance does not completely cover the cost of the ZIO patch monitor. You must apply for the  Patient Assistance Program to qualify for the discounted rate. To apply, call [employer] at [phone],  select option 4, select option 2 , ask to apply for the Patient Assistance Program, (you can request an  interpreter if needed). [employer] will ask your household income and how many people are in your  household. [employer] will quote your out-of-pocket cost based on this information. They will also be  able  to set up a 12 month interest free payment plan if needed.  Applying the monitor   Shave hair from upper left chest.  Hold the abrader disc by orange tab. Rub the abrader in 40 strokes over left upper chest as indicated in  your monitor instructions.  Clean area with 4 enclosed alcohol pads. Use  all pads to ensure the area is cleaned thoroughly. Let  dry.  Apply patch as indicated in monitor instructions. Patch will be placed under collarbone on left side of  chest with arrow pointing upward.  Rub patch adhesive wings for 2 minutes. Remove the white label marked 1. Remove the white label  marked 2. Rub patch adhesive wings for 2 additional minutes.  While looking in a mirror, press and release button in center of patch. A small green light will flash 3-4  times. This will be your only indicator that the monitor has been turned on.  Do not shower for the first 24 hours. You may shower after the first 24 hours.  Press the button if you feel a symptom. You will hear a small click. Record Date, Time and Symptom in  the Patient Log.   Starting the Gateway  In your kit there is a audiological scientist box the size of a cellphone. This is [employer]. It transmits all your  recorded data to [facility]. This box must always stay within 10 feet of you. Open the box and push the *  button. There will be a light that blinks orange and then green a few times. When the light stops  blinking, the Gateway is connected to the ZIO patch. Call [employer] at [phone] to confirm your monitor is transmitting.  Returning your monitor  Remove your patch and place it inside the Gateway. In the lower half of the Gateway there is a white  bag with prepaid postage on it. Place Gateway in bag and seal. Mail package back to [place] as soon as  possible. Your physician should have your final report approximately 7 days after you have mailed back  your monitor. Call [facility] Customer Care at [phone] if you have questions regarding your ZIO AT  patch monitor. Call them immediately if you see an orange light blinking on your monitor.  If your monitor falls off in less than 4 days, contact our Monitor department at [phone]. If your  monitor becomes loose or falls off after 4 days call [employer]  at [phone] for suggestions on  securing your monitor  Follow-Up: At [facility], you and your health needs are our priority.  As part of our continuing mission to provide you with exceptional heart care, our providers are all part of one team.  This team includes your primary Cardiologist (physician) and Advanced Practice Providers or APPs (Physician Assistants and Nurse Practitioners) who all work together to provide you with the care you need, when you need it.  Your next appointment:   2 month(s)  Provider:   Hao Meng, PA-C          We recommend signing up for the patient portal called MyChart.  Sign up information is provided on this After Visit Summary.  MyChart is used to connect with patients for Virtual Visits (Telemedicine).  Patients are able to view lab/test results, encounter notes, upcoming appointments, etc.  Non-urgent messages can be sent to your provider as well.   To learn more about what you can do with MyChart, go to [email or web address].  Other Instructions

## 2024-05-27 NOTE — Progress Notes (Unsigned)
 " Cardiology Office Note   Date:  05/27/2024  ID:  Sherry, Padilla 1934/09/12, MRN [id number] PCP: Charlanne Fredia CROME, MD  [facility] Providers Cardiologist:  [facility]    History of Present Illness Sherry Padilla is a 89 y.o. female with right shoulder arthroplasty and hyperlipidemia.   She reportedly had a syncope in February 2024, workup was negative, it was felt to be due to dehydration.  ED workup obtained on 06/22/2022 was reassuring.  A CT of the head without contrast showed atrophy and chronic small vessel changes, right posterior parietal cephalohematoma, no acute intracranial process was identified.  Base metabolic panel and CBC were normal.  She was given IV fluid.  She was last seen by her PCP in July 2025 for bright red blood per rectum, this was felt to be due to internal hemorrhoid.  Anusol  suppository was recommended.  She currently resides at [facility].  More recently, patient presented back to the ED on 05/23/2024 with 2 back-to-back syncope.  She apparently was attending water aerobic and the chair yoga when she passed out twice according to other participant of the class.  She did not strike her head and the incidents happened while she was sitting in the chair.  Patient mention she felt hot and lightheaded prior to the event as well as having a sharp pain in the chest.  Orthostatic vital signs obtained in the ED was negative.  Patient's heart rate did became mildly tachycardic during the orthostatic assessment, however this resolved quickly.  Chest x-ray showed lucency beneath the left hemidiaphragm versus bandlike left basilar atelectasis, CT scan was recommended to exclude intraperitoneal gas, hazy left basilar opacity that suggest either atelectasis versus pneumonia, mild subsegmental atelectasis or along the right hemidiaphragm, elevated left hemidiaphragm, aortic atherosclerosis calcification.  CMP was normal with creatinine of 0.69,  potassium 4.0.  CBC showed a normal hemoglobin, white blood cell count and platelet.  Serial troponin negative x 2.  Urinalysis was negative.  Patient was referred to cardiology service by ED physician.  [facility] of [facility]ope.  She stays fairly active at [facility] doing water aerobic 3 days a week.  She denies any exertional chest pain or shortness of breath.  She had brief episode of sharp chest tightness during the passing out.  She says she felt hot while sitting on the yoga chair, next thing she knows she was on the floor.  He does not remember passing out twice.  Since then, she has been able to walk around at the independent living facility without any issue for the past few days.  I recommended echocardiogram, 2-week heart monitor and carotid ultrasound.  I suspect she may have had a vasovagal event.  I plan to bring the patient back in 2 months for reassessment, if above tests are negative and she does not have any further event in the next 2 months, no further workup is recommended.  ROS: ***  Studies Reviewed      *** Risk Assessment/Calculations {Does this patient have ATRIAL FIBRILLATION?:[id number]}         Physical Exam VS:  BP 136/80   Pulse 94   Ht 5' 1 (1.549 m)   Wt 137 lb (62.1 kg)   SpO2 99%   BMI 25.89 kg/m   Orthostatic VS for the past 24 hrs (Last 3 readings):  BP- Lying Pulse- Lying BP- Sitting Pulse- Sitting BP- Standing at 0 minutes Pulse-  Standing at 0 minutes BP- Standing at 3 minutes Pulse- Standing at 3 minutes  05/27/24 1422 126/79 96 112/72 102 119/80 109 131/82 108      Wt Readings from Last 3 Encounters:  05/27/24 137 lb (62.1 kg)  05/15/24 137 lb 3.2 oz (62.2 kg)  03/05/24 136 lb 9.6 oz (62 kg)    GEN: Well nourished, well developed in no acute distress NECK: No JVD; No carotid bruits CARDIAC: ***RRR, no murmurs, rubs, gallops RESPIRATORY:  Clear to auscultation without rales, wheezing or rhonchi   ABDOMEN: Soft, non-tender, non-distended EXTREMITIES:  No edema; No deformity   ASSESSMENT AND PLAN ***    {Are you ordering a CV Procedure (e.g. stress test, cath, DCCV, TEE, etc)?   Press F2        :[id number]}  Dispo: ***  Signed, Scot Ford, PA  "

## 2024-05-27 NOTE — Progress Notes (Unsigned)
Enrolled for [employer] to mail a ZIO AT Live Telemetry monitor to patients address on file.   Dr.Tobb to read. 

## 2024-06-09 ENCOUNTER — Telehealth: Payer: Self-pay | Admitting: Physician Assistant

## 2024-06-09 NOTE — Telephone Encounter (Signed)
 Paged by [facility] regarding a single episode of SVT with heart rate of 194 bpm at 4:27 PM this afternoon, lasted 85 seconds.  Per report, Meredeth has already reached out to the patient, patient was asymptomatic at the time.  Subsequent transmission show patient has went back into sinus rhythm.  Will continue monitoring.

## 2024-06-10 ENCOUNTER — Telehealth: Payer: Self-pay | Admitting: Physician Assistant

## 2024-06-10 NOTE — Telephone Encounter (Signed)
 Caller [employer]) is reporting abnormal results.

## 2024-06-10 NOTE — Telephone Encounter (Signed)
 Spoke with patient in attempt to schedule appt with APP this week. Pt stated she already had an appt next week with Dr Charlanne at [facility] and her granddaughter would be with her, as she goes to all appointments.  Pt given Dr Sheena recommendation to be seen this week, but pt persistent to wait until next week and also stated she was just getting ready this am when monitor alerted.   Will update Dr Sheena.  All questions, if any, were answered.

## 2024-06-11 ENCOUNTER — Encounter: Admitting: Internal Medicine

## 2024-06-17 ENCOUNTER — Ambulatory Visit (HOSPITAL_BASED_OUTPATIENT_CLINIC_OR_DEPARTMENT_OTHER)

## 2024-06-18 ENCOUNTER — Encounter: Admitting: Internal Medicine

## 2024-07-28 ENCOUNTER — Ambulatory Visit: Admitting: Physician Assistant

## 2025-05-14 ENCOUNTER — Encounter: Admitting: Nurse Practitioner
